# Patient Record
Sex: Female | Born: 1970 | Race: Black or African American | Hispanic: No | Smoking: Current every day smoker
Health system: Southern US, Community
[De-identification: ages and names within clinical notes are randomized; demographics above are authoritative.]

## PROBLEM LIST (undated history)

## (undated) DIAGNOSIS — R7303 Prediabetes: Secondary | ICD-10-CM

## (undated) DIAGNOSIS — G35D Multiple sclerosis, unspecified: Secondary | ICD-10-CM

## (undated) DIAGNOSIS — G4730 Sleep apnea, unspecified: Secondary | ICD-10-CM

## (undated) DIAGNOSIS — G35 Multiple sclerosis: Secondary | ICD-10-CM

## (undated) DIAGNOSIS — I10 Essential (primary) hypertension: Secondary | ICD-10-CM

## (undated) HISTORY — DX: Multiple sclerosis, unspecified: G35.D

## (undated) HISTORY — PX: BACK SURGERY: SHX140

## (undated) HISTORY — DX: Prediabetes: R73.03

## (undated) HISTORY — PX: ABDOMINAL HYSTERECTOMY: SHX81

## (undated) HISTORY — DX: Multiple sclerosis: G35

## (undated) HISTORY — PX: OTHER SURGICAL HISTORY: SHX169

---

## 1999-12-07 ENCOUNTER — Other Ambulatory Visit: Admission: RE | Admit: 1999-12-07 | Discharge: 1999-12-07 | Payer: Self-pay | Admitting: Obstetrics and Gynecology

## 2000-01-11 ENCOUNTER — Ambulatory Visit (HOSPITAL_COMMUNITY): Admission: RE | Admit: 2000-01-11 | Discharge: 2000-01-11 | Payer: Self-pay | Admitting: Orthopedic Surgery

## 2000-01-11 ENCOUNTER — Encounter: Payer: Self-pay | Admitting: Orthopedic Surgery

## 2000-01-25 ENCOUNTER — Ambulatory Visit (HOSPITAL_COMMUNITY): Admission: RE | Admit: 2000-01-25 | Discharge: 2000-01-25 | Payer: Self-pay | Admitting: Orthopedic Surgery

## 2000-01-25 ENCOUNTER — Encounter: Payer: Self-pay | Admitting: Orthopedic Surgery

## 2000-02-10 ENCOUNTER — Encounter: Payer: Self-pay | Admitting: Orthopedic Surgery

## 2000-02-10 ENCOUNTER — Ambulatory Visit (HOSPITAL_COMMUNITY): Admission: RE | Admit: 2000-02-10 | Discharge: 2000-02-10 | Payer: Self-pay | Admitting: Pediatrics

## 2000-10-25 ENCOUNTER — Encounter: Payer: Self-pay | Admitting: Orthopedic Surgery

## 2000-10-25 ENCOUNTER — Encounter: Admission: RE | Admit: 2000-10-25 | Discharge: 2000-10-25 | Payer: Self-pay | Admitting: Orthopedic Surgery

## 2000-11-15 ENCOUNTER — Encounter: Payer: Self-pay | Admitting: Orthopedic Surgery

## 2000-11-15 ENCOUNTER — Encounter: Admission: RE | Admit: 2000-11-15 | Discharge: 2000-11-15 | Payer: Self-pay | Admitting: Orthopedic Surgery

## 2000-11-29 ENCOUNTER — Encounter: Admission: RE | Admit: 2000-11-29 | Discharge: 2000-11-29 | Payer: Self-pay | Admitting: Orthopedic Surgery

## 2000-11-29 ENCOUNTER — Encounter: Payer: Self-pay | Admitting: Orthopedic Surgery

## 2000-12-02 ENCOUNTER — Encounter: Admission: RE | Admit: 2000-12-02 | Discharge: 2000-12-02 | Payer: Self-pay | Admitting: Orthopedic Surgery

## 2001-04-03 ENCOUNTER — Ambulatory Visit (HOSPITAL_COMMUNITY): Admission: RE | Admit: 2001-04-03 | Discharge: 2001-04-03 | Payer: Self-pay | Admitting: Obstetrics and Gynecology

## 2001-04-03 ENCOUNTER — Encounter: Payer: Self-pay | Admitting: Obstetrics and Gynecology

## 2001-04-10 ENCOUNTER — Other Ambulatory Visit: Admission: RE | Admit: 2001-04-10 | Discharge: 2001-04-10 | Payer: Self-pay | Admitting: Obstetrics and Gynecology

## 2002-05-16 ENCOUNTER — Other Ambulatory Visit: Admission: RE | Admit: 2002-05-16 | Discharge: 2002-05-16 | Payer: Self-pay | Admitting: Obstetrics and Gynecology

## 2002-05-25 ENCOUNTER — Inpatient Hospital Stay (HOSPITAL_COMMUNITY): Admission: RE | Admit: 2002-05-25 | Discharge: 2002-05-26 | Payer: Self-pay | Admitting: Obstetrics and Gynecology

## 2002-05-25 ENCOUNTER — Encounter (INDEPENDENT_AMBULATORY_CARE_PROVIDER_SITE_OTHER): Payer: Self-pay

## 2002-06-06 ENCOUNTER — Encounter: Payer: Self-pay | Admitting: Obstetrics and Gynecology

## 2002-06-06 ENCOUNTER — Inpatient Hospital Stay (HOSPITAL_COMMUNITY): Admission: AD | Admit: 2002-06-06 | Discharge: 2002-06-06 | Payer: Self-pay | Admitting: Obstetrics and Gynecology

## 2003-05-15 ENCOUNTER — Emergency Department (HOSPITAL_COMMUNITY): Admission: EM | Admit: 2003-05-15 | Discharge: 2003-05-15 | Payer: Self-pay | Admitting: Emergency Medicine

## 2003-05-20 ENCOUNTER — Other Ambulatory Visit: Admission: RE | Admit: 2003-05-20 | Discharge: 2003-05-20 | Payer: Self-pay | Admitting: Obstetrics and Gynecology

## 2003-09-17 ENCOUNTER — Encounter: Admission: RE | Admit: 2003-09-17 | Discharge: 2003-09-27 | Payer: Self-pay | Admitting: Neurology

## 2004-05-25 ENCOUNTER — Ambulatory Visit: Payer: Self-pay | Admitting: Family Medicine

## 2004-08-27 ENCOUNTER — Ambulatory Visit: Payer: Self-pay | Admitting: Family Medicine

## 2004-09-16 ENCOUNTER — Emergency Department (HOSPITAL_COMMUNITY): Admission: EM | Admit: 2004-09-16 | Discharge: 2004-09-17 | Payer: Self-pay | Admitting: Emergency Medicine

## 2005-04-28 ENCOUNTER — Emergency Department (HOSPITAL_COMMUNITY): Admission: EM | Admit: 2005-04-28 | Discharge: 2005-04-29 | Payer: Self-pay | Admitting: Emergency Medicine

## 2005-06-14 ENCOUNTER — Emergency Department (HOSPITAL_COMMUNITY): Admission: EM | Admit: 2005-06-14 | Discharge: 2005-06-15 | Payer: Self-pay | Admitting: Emergency Medicine

## 2005-11-10 ENCOUNTER — Inpatient Hospital Stay (HOSPITAL_COMMUNITY): Admission: RE | Admit: 2005-11-10 | Discharge: 2005-11-13 | Payer: Self-pay | Admitting: Orthopaedic Surgery

## 2006-05-20 ENCOUNTER — Emergency Department (HOSPITAL_COMMUNITY): Admission: EM | Admit: 2006-05-20 | Discharge: 2006-05-20 | Payer: Self-pay | Admitting: Family Medicine

## 2006-05-26 ENCOUNTER — Encounter: Admission: RE | Admit: 2006-05-26 | Discharge: 2006-05-26 | Payer: Self-pay | Admitting: *Deleted

## 2006-06-02 ENCOUNTER — Encounter: Admission: RE | Admit: 2006-06-02 | Discharge: 2006-06-02 | Payer: Self-pay | Admitting: Rheumatology

## 2006-09-16 ENCOUNTER — Other Ambulatory Visit: Admission: RE | Admit: 2006-09-16 | Discharge: 2006-09-16 | Payer: Self-pay | Admitting: *Deleted

## 2007-06-23 ENCOUNTER — Encounter: Admission: RE | Admit: 2007-06-23 | Discharge: 2007-06-23 | Payer: Self-pay | Admitting: Family Medicine

## 2010-09-11 NOTE — Discharge Summary (Signed)
   NAME:  Brooke Ballard, Brooke Ballard                          ACCOUNT NO.:  [id number]   MEDICAL RECORD NO.:  [id number]                   PATIENT TYPE:  INP   LOCATION:  9304                                 FACILITY:  [facility]   PHYSICIAN:  Naima A. Dillard, M.D.              DATE OF BIRTH:  July 26, 1970   DATE OF ADMISSION:  05/25/2002  DATE OF DISCHARGE:  05/26/2002                                 DISCHARGE SUMMARY   ADMISSION DIAGNOSES:  1. Symptomatic fibroids  2. Menorrhagia.   DISCHARGE DIAGNOSES:  Status post laparoscopic-assisted vagina hysterectomy  and lysis of adhesions for fibroids causing menorrhagia and also pelvic  adhesions.   PROCEDURES:  Laparoscopic-assisted vaginal hysterectomy and lysis of  adhesions.   HOSPITAL COURSE:  The patient underwent a laparoscopic-assisted vaginal  hysterectomy on 05/25/2002 for adhesions and symptomatic fibroids.  The  surgery was uncomplicated.  Postoperatively, the patient remained afebrile  with stable vital signs.  Her exam showed normal abdomen with good bowel  sounds, soft and nontender.  She had mild vaginal bleeding, scant draining  on her packing when removed.   Pertinent laboratories showed that her preop hemoglobin was 11.6.  Postoperative hemoglobin 9.1.  Preoperative BUN and creatinine were 11 and  0.8, postop 7 and 0.7.  The patient is doing well.  Her  Foley was  discontinued.  Her packing was removed. She will eat a regular diet, and,  once voiding, can be discharged home.                                               Naima A. Normand Sloop, M.D.    NAD/MEDQ  D:  05/26/2002  T:  05/26/2002  Job:  [id number]

## 2010-09-11 NOTE — Discharge Summary (Signed)
NAMEMELVIN, Brooke Ballard                ACCOUNT NO.:  [id number]   MEDICAL RECORD NO.:  [id number]          PATIENT TYPE:  INP   LOCATION:  3030                         FACILITY:  [facility]   PHYSICIAN:  Sharolyn Douglas, M.D.        DATE OF BIRTH:  [birth year]   DATE OF ADMISSION:  11/10/2005  DATE OF DISCHARGE:  11/13/2005                                 DISCHARGE SUMMARY   ADMITTING DIAGNOSES:  1. L5-S1 spondylolisthesis grade 2.  2. Hypertension.  3. History of ulcers.   DISCHARGE DIAGNOSES:  1. Status post L4-S1 posterior spinal fusion doing well.  2. Postoperative blood loss anemia.  3. L5-S1 spondylolisthesis, grade 2.  4. Hypertension.  5. History of ulcers.   PROCEDURES:  On July 18th, patient was taken to the operating room for an L4-  S1 posterior spinal fusion with pedicle screws as well as an L5-S1 TLIF and  an L5 laminectomy.  Surgeon was Sharolyn Douglas.  Assistant was [employer].  Anesthesia used was general.   CONSULTS:  None.   LABS:  Preoperative CBC with diff within normal limits with the exception of  white count slightly elevated at 10.6, PT/INR and PTT normal, complete  metabolic panel was normal.  Postoperatively, H&H were monitored daily which  were low of 11.0 and 32.9 on November 13, 2005 and was asymptomatic.  Basic  metabolic panel monitored x2 days postoperatively did show a slightly  elevated glucose at 116 and 124 on postop day one and two, respectively,  also calcium decreased to 7.9 and 8.1 on postop day one and two,  respectively.  UA was negative.  Blood typing was type O-positive, antibody  screen was negative.   EKG done on November 04, 2005 showed normal sinus rhythm, no changes since last  tracing in January of 2005 read by Lady Deutscher, M.D.   X-rays:  Complete lumbar series on November 10, 2005 does show status post  placement of screws at L4-5 and L5-S1, no complicating features, on July 21  shows status post L4-S1 fusion.   BRIEF HISTORY:  Patient is a  40 year old female who has had longstanding  problems with her back.  Her pain has been getting progressively worse.  She  has failed improvement with conservative treatments that we have tried.  The  pain at this point is interfering with her activities of daily living and  inability to work.  She describes the pain as severe.  It was felt her best  course of management secondary to her grade 2 anterolisthesis in L5-S1  surgical intervention would be warranted.  It was felt secondary to the  severity of __________ and the patient's size, we would need to do a two-  level lumbar fusion at L4-5 and L5-S1.  Risks and benefits of this procedure  were discussed with the patient at length by Dr. Noel Gerold as well as myself,  she indicated understanding and opted to proceed.   HOSPITAL COURSE:  On November 10, 2005, patient was admitted to the hospital and  taken to the operating room for the above-listed procedure.  She tolerated  procedure well without any intraoperative complications and was transferred  to the recovery room in stable condition.  There was 500 mL of blood loss  intraoperatively and one unit returned via cell saver.   Postoperatively, routine orthopedic spine protocol was followed, she  progressed along well and did not develop any significant complications  postoperatively.   Physical therapy and occupational therapy did work with her on a daily basis  and she progressed along extremely well with them.   By November 13, 2005, patient had met all orthopedic goals, she was stable,  ambulating, she was independent with her brace and she was medically stable  as well for discharge.   DISCHARGE PLAN:  Patient is a 40 year old female status post L4-S1 posterior  spinal fusion doing well, activity is daily ambulation program, brace on  when she is up.  No lifting heavier than five pounds.  Progressive  ambulation program.  Back precautions at all times.  Follow up two weeks   postoperatively with Dr. Noel Gerold.   DIET:  Regular home diet as tolerated.   MEDICATIONS:  1. Vicodin for pain.  2. Robaxin for muscle spasm.  3. Multivitamin daily.  4. Calcium daily.  5. Colace daily.  6. Laxative as needed.   CONDITION ON DISCHARGE:  Stable, improved.   DISPOSITION:  The patient is being discharged to her home with her family's  assistance as well as home health physical therapy and occupational therapy.      Verlin Fester, P.A.      Sharolyn Douglas, M.D.  Electronically Signed    CM/MEDQ  D:  01/05/2006  T:  01/05/2006  Job:  [id number]

## 2010-09-11 NOTE — H&P (Signed)
NAME:  Brooke Ballard, Brooke Ballard NO.:  [id number]   MEDICAL RECORD NO.:  [id number]           PATIENT TYPE:   LOCATION:                               FACILITY:  [facility]   PHYSICIAN:  Sharolyn Douglas, M.D.        DATE OF BIRTH:  1970/10/11   DATE OF ADMISSION:  DATE OF DISCHARGE:                                HISTORY & PHYSICAL   CHIEF COMPLAINT:  Low back and left lower extremity pain.   HISTORY OF PRESENT ILLNESS:  This is a 40 year old female who has been  having problems with her back for several months now.  Unfortunately her  pain has been getting progressively worse.  She has failed to improve with  any conservative treatments.  Her pain is so severe at this point it is  interfering with her activities of daily living and ability to work.  The  risks and benefits of the proposed surgery and L4-S1 posterior spinal fusion  were discussed with the patient by Dr. Noel Gerold previously and again today by  myself.  She indicated understanding and opted to proceed.  Because of the  severity of her anterolisthesis at L5-S1, it was thought she would do best  with the L4-S1 fusion.   ALLERGIES:  None.  She is intolerant to MORPHINE.   MEDICATIONS:  Toprol, Valtrex, and Tylenol.   PAST MEDICAL HISTORY:  Hypertension.   PAST SURGICAL HISTORY:  1.  Left wrist ganglion removed.  2.  C-section.  3.  Tubal ligation.  4.  Hysterectomy.   SOCIAL HISTORY:  The patient smokes 4-5 cigarettes per day, drinks 1-2  drinks of wine per week.  Her family will be available to help her through  her postoperative course.  She is separated.   FAMILY HISTORY:  Noncontributory.   REVIEW OF SYSTEMS:  The patient denies fevers, chills, sweats, or bleeding  tendencies.  CNS:  Denies blurred vision, double vision, seizures,  headaches, paralysis.  CARDIOVASCULAR:  Denies chest pain, angina,  orthopnea, claudication, or palpitations.  PULMONARY:  Denies shortness of  breath, __________ , or hemoptysis.   GASTROINTESTINAL:  Denies nausea,  vomiting, constipation, diarrhea, melena, bloody stools.  GENITOURINARY:  Denies dysuria, hematuria, or discharge.  MUSCULOSKELETAL:  As per HPI.   PHYSICAL EXAMINATION:  VITAL SIGNS:  Pulse is 76 and regular, respirations  16 and unlabored, blood pressure is 160/100.  I did discuss this with her  today.  She states that she has not taken her blood pressure medication for  about one week.  I stressed the importance of getting back on her blood  pressure medication.  She is going to go to the pharmacy and get this filled  today.  I do anticipate her blood pressure will be under better control when  she comes to the hospital for her preoperative exam.  GENERAL APPEARANCE:  The patient is a 40 year old black female who is alert  and oriented, in no acute distress.  She is well-nourished, well-groomed,  appears stated  age, pleasant and cooperative to exam.  She is significantly  overweight.  HEENT:  Head is normocephalic, atraumatic.  Pupils equal, round, and  reactive to light.  Extraocular movements intact.  Nares patent.  Pharynx is  clear.  NECK:  Supple to palpation.  No lymphadenopathy, thyromegaly, or bruits  appreciated.  CHEST:  Clear to auscultation bilaterally.  No rales, rhonchi, stridor ,  wheezing, friction, or rubs.  BREASTS:  Not pertinent, not performed.  HEART:  S1/S2, regular rate and rhythm.  No murmurs, gallops, or rubs noted.  ABDOMEN:  Soft to palpation.  Nontender, nondistended.  No organomegaly  noted.  Positive bowel sounds  throughout.Marland Kitchen  GENITOURINARY:  Not pertinent, not performed.  EXTREMITIES:  As per HPI.  SKIN:  Intact without any lesions or rashes.   LABORATORY DATA:  X-ray showed a grade III isthmic spondylolisthesis at L5-  S1.   IMPRESSION:  1.  Spondylolisthesis, L5-S1.  2.  Hypertension.  3.  History of ulcers.   PLAN:  1.  Admit to [facility], November 10, 2005, for an L4-S1 posterior      spinal  fusion.  We are going up to the L4 level secondary to the      severity of her slip as well as her size for extra points of fixation.      Dr. Noel Gerold will be doing the surgery.  2.  The patient will receive PCA, Dilaudid postoperatively because of her      intolerance to morphine.  We will also use Vicodin for pain.  3.  The patient will likely be discharged to her home with the family's      assistance as well as home health physical therapy and occupational      therapy through [place].      Verlin Fester, P.A.      Sharolyn Douglas, M.D.  Electronically Signed    CM/MEDQ  D:  10/26/2005  T:  10/26/2005  Job:  [id number]   cc:   Sharolyn Douglas, M.D.  Fax: [phone]

## 2010-09-11 NOTE — Op Note (Signed)
NAMEBRADEN, Ballard                ACCOUNT NO.:  [id number]   MEDICAL RECORD NO.:  [id number]          PATIENT TYPE:  INP   LOCATION:  3030                         FACILITY:  [facility]   PHYSICIAN:  Sharolyn Douglas, M.D.        DATE OF BIRTH:  22-Sep-1970   DATE OF PROCEDURE:  11/10/2005  DATE OF DISCHARGE:                                 OPERATIVE REPORT   DIAGNOSIS:  1.  L5-S1 isthmic spondylolisthesis.  2.  Left greater than right L5 radiculopathy.  3.  L4-L5 degenerative disk disease and spondylosis.   PROCEDURE:  1.  L4-L5 and L5-S1 laminectomy with wide decompression of the thecal sac      and nerve roots bilaterally.  2.  L4-S1 posterior spinal arthrodesis.  3.  Transforaminal lumbar interbody fusion L5-S1 with placement of the 9-mm      PEEK cage.  4.  Segmental pedicle screw instrumentation L4 through S1 using the Abbott      spine system.  5.  Local autogenous bone graft supplemented with bone morphogenic protein.   SURGEON:  Sharolyn Douglas, M.D.   ASSISTANTJill Side Mahar, P.A.-C.   ANESTHESIA:  General endotracheal anesthesia.   ESTIMATED BLOOD LOSS:  400 mL.   COMPLICATIONS:  None.   INDICATIONS:  The patient is a pleasant lady with progressive back and left  greater than right lower extremity pain and radiculopathy.  Her imaging  studies demonstrated grade 2 spondylolisthesis with motion on flexion and  extension radiographs.  She has failed to respond to conservative treatment  measures.  At this point, she elected to undergo L4 through S1 decompression  and fusion in hopes of improving her symptoms.  Risks and benefits were  extensively reviewed.   PROCEDURE:  She was identified in the holding area and taken to the  operating room.  She underwent general endotracheal anesthesia without  difficulty and given prophylactic IV antibiotics.  She was carefully turned  prone onto the Wilson frame.  All bony prominences were padded.  The face  and eyes were protected at all  times.  She is a large woman and we did our  best to decompress her abdomen.  Neural monitoring was established in the  form of SSEPs and lower extremity EMGs.  The back was prepped and draped in  the usual sterile fashion.  A midline incision was made from L3 down to the  sacrum.  Dissection was carried sharply through the deep fascia.  A  subperiosteal exposure was then carried out to the tips the transverse  processes of L4, L5, as well as the sacral ala bilaterally.  The L5 spinous  process was loose consistent with the pars fractures.  Interoperative x-ray  was taken to confirm appropriate levels.  Deep retractors were placed.  We  then turned our attention to performing a laminectomy by removing the entire  spinous process and lamina of the L5 as well as inferior 1/3 of the L4  lamina and spinous process.  The ligamentum flavum was removed piecemeal.  We found that the L5-S1 foramen were encumbered due  to the spondylolisthesis  and fibrous material from the pars fractures.  The L5 nerve roots were  severely compressed by the pseudo-disk bulge and the spondylolisthesis with  compression of the nerve root between the L5 pedicle and the lip of the  sacrum.  This was decompressed widely using the Kerrison punches.  Once the  L5 nerve roots were widely decompressed bilaterally, the S1 nerve roots were  identified.  Decompression was carried distally.  We then evaluated the  foramen.  It was clear that there was ongoing pressure due to the slippage  and the posterior level of the sacrum compressing the nerve root up against  the L5 pedicles.  At this point, we placed pedicle screws at L4, L5 and S1  bilaterally using anatomic probing technique, we could also palpate the  pedicles from within the spinal canal.  We utilized 6.5 x 50 mm screws in  L4, 6.5 x 45 mm screws in L5, and 7.5 x 30 mm screws in the sacrum.  The  bone quality was excellent and the screw purchase was good.  We  stimulated  each screw using triggered EMGs and there were no deleterious changes.  We  then turned our attention to performing a transforaminal lumbar interbody  fusion on the left side at L5-S1.  The disk space was identified and the  remaining facette joints osteotomized.  The L5 and S1 nerve roots were  visualized and protected at all times and free running EMGs were monitored.  The disk space was entered and dilators were used to progressively dilate  the space up to 9 mm.  This caused an indirect reduction of  the  spondylolisthesis.  The cartilaginous endplates were scraped clean and a  radical diskectomy was completed.  We then packed the disk space with BMP  sponges along with bone graft taken from the laminectomy.  The 9-mm PEEK  cage was inserted into the interspace, tapped tamped anteriorly and across  the midline.  We then completed the posterior fusion by decorticating the  transverse processes of L4, L5, and the sacral ala bilaterally.  The  remaining bone graft was packed into the lateral gutters.  At this point, we  turned our attention to taking an interoperative x-ray which showed good  position of the pedicle screws and TLIF graft.  We bent titanium rods into  lordosis purposely under bending the rod in the middle to allow for further  reduction of the spondylolisthesis.  We placed the rods into the polyaxial  screw heads and used the reduction device to pull the L5 screws posteriorly,  further reducing the spondylolisthesis.  We took another interoperative x-  ray after the reduction maneuver and the slip was 90% reduced.  We then  sheared off the locking caps and placed a cross connector.  The wound was  irrigated.  A final check of the L5-S1 foramen showed that the nerves were  now widely decompressed.  Gelfoam was left over the exposed epidural space.  Hemostasis was achieved.  A Hemovac drain was left in place.  The deep fascia was closed with a running #1 Vicryl  suture.  The subcutaneous layer  was closed with 0 Vicryl and 2-0 Vicryl followed by a running 3-0  subcuticular Vicryl suture on the skin edges.  Benzoin and Steri-Strips were  placed.  A sterile dressing was applied.  The patient was turned supine,  extubated without difficulty, and transferred to recovery in stable  condition, able to move  her upper and lower extremities.  There were no  changes in the EMGs or SSEPs.  Sponge and needle count was correct.  It  should be noted that my assistant helped throughout the procedure including  positioning during the exposure, during the decompression, the  instrumentation, and also with arthrodesis.  She assisted with wound  closure.      Sharolyn Douglas, M.D.  Electronically Signed     MC/MEDQ  D:  11/10/2005  T:  11/10/2005  Job:  [id number]

## 2010-09-11 NOTE — Op Note (Signed)
NAME:  Brooke Ballard, Brooke Ballard                          ACCOUNT NO.:  [id number]   MEDICAL RECORD NO.:  [id number]                   PATIENT TYPE:  INP   LOCATION:  9399                                 FACILITY:  [facility]   PHYSICIAN:  Naima A. Dillard, M.D.              DATE OF BIRTH:  1970-08-12   DATE OF PROCEDURE:  05/25/2002  DATE OF DISCHARGE:                                 OPERATIVE REPORT   PREOPERATIVE DIAGNOSES:  1. Symptomatic fibroids.  2. Menorrhagia.  3. Dysmenorrhea.   POSTOPERATIVE DIAGNOSES:  1. Symptomatic fibroids.  2. Menorrhagia.  3. Dysmenorrhea.  4. Pelvic adhesions.   PROCEDURES:  1. Laparoscopically assisted vaginal hysterectomy.  2. Lysis of adhesions.   ANESTHESIA:  General endotracheal tube.   SURGEON:  Naima A. Normand Sloop, M.D.   ASSISTANT:  Dois Davenport A. Rivard, M.D.   ESTIMATED BLOOD LOSS:  550 mL.   URINE OUTPUT:  450 mL clear urine at the end of the procedure.   INTRAVENOUS FLUIDS:  2700 mL crystalloid.   FINDINGS:  Dense bladder and uterine adhesions and also lower  uterine/anterior abdominal wall adhesions, about 14 weeks size fibroid  uterus and normal appearing liver and bowel.   COMPLICATIONS:  None.   DISPOSITION:  The patient went to the recovery room in stable condition.   DESCRIPTION OF PROCEDURE:  Before the patient was taken to the operating  room, she understood that her risks were, but not limited to, anesthetic  complications, damage to bowel, bladder, major blood vessels, and ureters,  __________, and exploratory laparotomy with same, hysterectomy.  The patient  was taken to the operating room.  She was placed in dorsal supine position  oblique due to a history of back disk injury to make sure she was not having  any pain and then put asleep.  She was then prepped and draped in a normal  sterile fashion.  A bivalve speculum was placed into the vagina.   The anterior lip of the cervix was grasped with a single tooth tenaculum and  the uterine manipulator Hulka was placed into the uterine cavity and  attached to the cervix.  The single tooth tenaculum was removed.  The  bivalve speculum was removed.  A Foley catheter was placed.   Attention was then turned to the patient's abdomen where a 10 mm  supraumbilical incision was made with the scalpel.  This was done because  the patient had a previous umbilical hernia repair.  The incision was  carried down to the fascia.  The fascia was then incised in the midline,  extended bilaterally by using Mayo scissors.  The peritoneum was then  identified, tented up and entered sharply and we were noted to be in the  peritoneal cavity.  The fascia was then sutured with #0 Vicryl in a  pursestring fashion.  A Hasson was then placed into the cavity and the  abdomen was  insufflated with about 3 L of CO2 gas.  Intraabdominal placement  was confirmed with the laparoscope.  Findings noted above were seen.   It was then felt that her cul-de-sac was probably free with Trendelenburg so  we placed a 5 mm incision after 5 mL 0.25% Marcaine was placed in the right  lower quadrant.  A 5 mm incision was then made with a scalpel and a 5 mm  trocar was placed under direct visualization with the laparoscope.  Hemostasis was noted.  A probe was then placed into the abdominal cavity and  the bowel was moved away from the cul-de-sac.  The cul-de-sac was noted to  be free.   A similar incision on the left abdominal quadrant was made with the scalpel  after infiltrating 2.5 mL of 0.25% Marcaine.  A 5 mm trocar was placed under  direct visualization of the laparoscope.  Hemostasis was noted.  Attention  was then turned to the patient's right round ligament which was cauterized  and cut.  Hemostasis was assured.  The patient was noted to have a missing  right ovary and tube secondary to her previous history of having it removed.  Also, there was an anterior abdominal/ uterine wall adhesion which was   fulgurated and ligated using tripolar cautery and hemostasis was assured.  The uterine artery was dissected and fulgurated and hemostasis was noted.   Attention was then turned to the patient's left round ligament which was  fulgurated x3 and cut.  The vesicouterine peritoneum was then identified and  entered sharply with Metzenbaum scissors, then both sharply and dissected  the bladder away from the uterus and cervix.  The left utero-ovarian  ligament was fulgurated and cut.  Hemostasis was assured.  This dissection  carried down to the uterine artery which was just fulgurated.   Attention was then turned to the patient's vagina.  A weighted speculum was  placed in the posterior peritoneum of the vagina and vaginal retractors  right angles were then used to retract.  The cervix was then infiltrated  with vasopressin 20 units and 100 mL of saline.  Approximately 20 mL was  infiltrated in the cervix in a circumferential fashion.  Circumferential  incision was then made with the scalpel and using Metzenbaum scissors  further dissection was then done.  Also, blunt dissection was done with a  sponge on my finger.  The posterior vaginal wall was then grasped with  pickups with teeth and the posterior cul-de-sac was then entered with Mayo  scissors and the large weighted speculum was placed into the peritoneal  cavity.  Hemostasis was assured.   Attention was then turned to the anterior portion of the uterus and it was  lifted up with pickups with teeth and Metzenbaum scissors and the right side  of the anterior peritoneum was entered sharply.  Hemostasis was assured.  The bladder was still noted to be intact on palpation.  The right  uterosacral ligament was clamped, cut, and suture ligated with #0 Vicryl.  Hemostasis was assured.  The right uterine artery pedicle was clamped with Heaney clamps, cut, and suture ligated.  Hemostasis was assured.  Attention  was then turned to the left.  There  was a large fibroid in the lower uterine  segment of the uterus.  It was difficult to see any of the pedicles.  The  left uterosacral ligament, however, was clamped with Heaney clamps, cut, and  suture ligated.  Hemostasis was assured.  No  further pedicles could be seen  at this point.  The myoma was removed by way of myomectomy and then the left  uterine artery pedicle was clamped, cut, and suture ligated.  Hemostasis was  assured.   The uterus was then delivered without difficulty and handed off for weight.  A sponge on a stick was placed into the vaginal cavity and all the pedicles  were noted to be hemostatic.  A McCall suture was then placed with #0  Vicryl.  The vaginal cuff was closed with #0 chromic on a UR6 using figure-  of-eight interrupted suture.  A sponge on a stick was then placed into the  vaginal cavity.   Attention was then turned to the abdomen.  The abdomen was reinsufflated.  The cuff was irrigated with normal saline.  There were a few areas that had  minimal bleeding right around the vaginal cuff.  This was made hemostatic  with tripolar cautery.  Full irrigation was done.  All clots were removed  from the vaginal floor and the gas was allowed to leave the abdomen.  The  instruments were removed under visualization with the laparoscope including  umbilical trocar, the pursestring suture placed was then tied and no defect  was noted.  The subcutaneous tissue was closed with #0 chromic in a running  fashion.  The skin incision was closed with 3-0 Vicryl in a subcuticular  fashion.  The sponge on a stick was then removed from the vagina and the  vagina was packed with 2 inch gauze soaked in clindamycin.  Sponge, lap, and  needle counts were correct x2.   The patient went to recovery room in stable condition.                                               Naima A. Normand Sloop, M.D.    NAD/MEDQ  D:  05/25/2002  T:  05/25/2002  Job:  [id number]

## 2010-09-11 NOTE — H&P (Signed)
NAME:  Brooke Ballard, Brooke Ballard                          ACCOUNT NO.:  [id number]   MEDICAL RECORD NO.:  [id number]                   PATIENT TYPE:  INP   LOCATION:  NA                                   FACILITY:  [facility]   PHYSICIAN:  Naima A. Dillard, M.D.              DATE OF BIRTH:  09-30-1970   DATE OF ADMISSION:  DATE OF DISCHARGE:                                HISTORY & PHYSICAL   HISTORY OF PRESENT ILLNESS:  The patient is a 39 year old African-American  female, gravida 3, para 3, whose last menstrual period was December 31, 2001.  The patient presented to me in February 2003 complaining of having  heavy painful periods unrelieved by Darvocet, Anaprox and Depo-Provera.  The  patient's last Depo was in November 2002 and it did cause her to irregular  bleeding, but it did not help with the pain as well as the pain medicines.  The patient has been having irregular bleeding and menorrhagia for about  five to six years.  The patient tried Depo-Provera one last time, but  despite the Depo-Provera still had irregular bleeding with dysmenorrhea and  has decided that she desires to have a hysterectomy.  The patient had an  ultrasound, which demonstrated her uterus to be 14.6 x 6.3 with a 6.3 cm  inferior fundal fibroid.   ALLERGIES:  The patient has no known drug allergies.   PAST SURGICAL HISTORY:  Significant for a tubal ligation with a cesarean  section.  Also a cesarean section and a right oophorectomy.  Hernia repair,  also cryosurgery of the cervix in 1986.   PAST MEDICAL HISTORY:  Significant for hypertension.   MEDICATIONS:  Medications include Toprol and acyclovir p.r.n. herpes  outbreak.   PAST SURGICAL HISTORY:  Negative for tobacco, alcohol or drug use.   PAST OBSTETRICAL HISTORY:  The patient's periods are irregular since being  on the Depo.  She has had irregular heavy bleeding causing pain.  she has a  history of herpes in the past.  Denies any history of an abnormal Pap  smear.   FAMILY HISTORY:  Significant for hypertension in her mother, diabetes in her  uncle and no history of Gyn cancer.   REVIEW OF SYSTEMS:  Unremarkable, except GYNECOLOGICAL history; as above.   PHYSICAL EXAMINATION:  VITAL SIGNS:  The patient weighs 296 pounds.  Blood  pressure is 140/98.  HEENT:  Pupils are equal.  Hearing is normal. Throat is clear.  NECK:  Thyroid is not enlarged.  HEART:  Regular rate and rhythm.  LUNGS:  Clear to auscultation bilaterally.  BREASTS:  No masses, discharge, skin changes or nipple retraction  bilaterally.  BACK:  No CVA tenderness bilaterally.  ABDOMEN:  Obese, nondistended.  Well healed incisions from previous  surgeries.  EXTREMITIES:  Have no cyanosis, clubbing or edema.  NEUROLOGIC EXAMINATION:  Within normal limits.  Nonfocal.  VAGINAL EXAMINATION:  Within normal limits.  Uterus is nontender without any  lesions.  It is difficult to determine the uterine size secondary to the  patient's obesity.  No cervical motion tenderness.  Uterus is nontender and  mobile.  Adnexa; she has no masses and no tenderness.   LABORATORY DATA:  Pertinent labs show endometrial biopsy was benign  proliferative endometrium.  No hyperplasia or malignancy.  Pap smear was  negative for intraepithelial lesions or malignancy.  Ultrasound as above.   ASSESSMENT:  1. Menorrhagia.  2. Anemia; last hemoglobin was 10.4, secondary to symptomatic fibroids.   The patient desires definitive treatment.  The patient was given options for  uterine artery embolization, Lupron, Depo-Provera and if her cavity looked  normal a Provera IUD, or hysterectomy.  The patient has chosen to proceed  with a hysterectomy.   We will attempt laparoscopic assisted vaginal hysterectomy due to a history  of multiple C-sections; however, if there are multiple adhesions that will  not allow me to proceed we will do a total abdominal hysterectomy.  The  patient understands that her risks  are, but not limited to anesthetic  complications, bleeding infection, damage to internal organs such as bowel,  bladder, ureters and major blood vessels, also poor wound healing from  healing secondary to her weight, and obesity; and, the risks and benefits of  treatments were reviewed.  The patient also only has one ovary and was told  that there is a possibility she could loose her ovary during the surgery,  which could cause her to go into menopause.  The patient still desired to  proceed with the surgery.  She also was checked for diabetes recently, which  was found to be negative.                                                 Naima A. Normand Sloop, M.D.    NAD/MEDQ  D:  05/24/2002  T:  05/25/2002  Job:  [id number]

## 2010-10-09 ENCOUNTER — Other Ambulatory Visit: Payer: Self-pay | Admitting: Family Medicine

## 2010-10-09 DIAGNOSIS — Z1231 Encounter for screening mammogram for malignant neoplasm of breast: Secondary | ICD-10-CM

## 2010-10-21 ENCOUNTER — Ambulatory Visit: Payer: Self-pay

## 2010-12-21 ENCOUNTER — Ambulatory Visit
Admission: RE | Admit: 2010-12-21 | Discharge: 2010-12-21 | Disposition: A | Discharge status: Home / Self Care | Payer: Medicaid Other | Source: Ambulatory Visit | Attending: Family Medicine | Admitting: Family Medicine

## 2010-12-21 ENCOUNTER — Other Ambulatory Visit: Payer: Self-pay | Admitting: Family Medicine

## 2010-12-21 DIAGNOSIS — J189 Pneumonia, unspecified organism: Secondary | ICD-10-CM

## 2011-08-16 ENCOUNTER — Other Ambulatory Visit: Payer: Self-pay | Admitting: Family Medicine

## 2011-08-16 ENCOUNTER — Ambulatory Visit
Admission: RE | Admit: 2011-08-16 | Discharge: 2011-08-16 | Disposition: A | Discharge status: Home / Self Care | Payer: BC Managed Care – PPO | Source: Ambulatory Visit | Attending: Family Medicine | Admitting: Family Medicine

## 2011-08-16 DIAGNOSIS — Z1231 Encounter for screening mammogram for malignant neoplasm of breast: Secondary | ICD-10-CM

## 2012-07-09 ENCOUNTER — Emergency Department (HOSPITAL_COMMUNITY): Payer: Managed Care, Other (non HMO)

## 2012-07-09 ENCOUNTER — Encounter (HOSPITAL_COMMUNITY): Payer: Self-pay | Admitting: Emergency Medicine

## 2012-07-09 ENCOUNTER — Emergency Department (HOSPITAL_COMMUNITY)
Admission: EM | Admit: 2012-07-09 | Discharge: 2012-07-10 | Disposition: A | Discharge status: Home / Self Care | Payer: Managed Care, Other (non HMO) | Attending: Emergency Medicine | Admitting: Emergency Medicine

## 2012-07-09 DIAGNOSIS — F17200 Nicotine dependence, unspecified, uncomplicated: Secondary | ICD-10-CM | POA: Insufficient documentation

## 2012-07-09 DIAGNOSIS — R0789 Other chest pain: Secondary | ICD-10-CM

## 2012-07-09 DIAGNOSIS — R071 Chest pain on breathing: Secondary | ICD-10-CM | POA: Insufficient documentation

## 2012-07-09 DIAGNOSIS — Z79899 Other long term (current) drug therapy: Secondary | ICD-10-CM | POA: Insufficient documentation

## 2012-07-09 DIAGNOSIS — I10 Essential (primary) hypertension: Secondary | ICD-10-CM | POA: Insufficient documentation

## 2012-07-09 DIAGNOSIS — IMO0002 Reserved for concepts with insufficient information to code with codable children: Secondary | ICD-10-CM | POA: Insufficient documentation

## 2012-07-09 HISTORY — DX: Essential (primary) hypertension: I10

## 2012-07-09 LAB — COMPREHENSIVE METABOLIC PANEL
ALT: 27 U/L (ref 0–35)
Albumin: 3.8 g/dL (ref 3.5–5.2)
BUN: 9 mg/dL (ref 6–23)
GFR calc Af Amer: >90 mL/min (ref >90)
GFR calc non Af Amer: >90 mL/min (ref >90)
Glucose, Bld: 100 mg/dL — ABNORMAL HIGH (ref 70–99)
Potassium: 3.8 mEq/L (ref 3.5–5.1)

## 2012-07-09 LAB — CBC WITH DIFFERENTIAL/PLATELET
Basophils Absolute: 0 10*3/uL (ref 0.0–0.1)
Basophils Relative: 0 % (ref 0–1)
Eosinophils Absolute: 0.2 10*3/uL (ref 0.0–0.7)
HCT: 44.2 % (ref 36.0–46.0)
Lymphs Abs: 4.1 10*3/uL — ABNORMAL HIGH (ref 0.7–4.0)
MCH: 30.4 pg (ref 26.0–34.0)
MCV: 87.9 fL (ref 78.0–100.0)
Monocytes Absolute: 0.6 10*3/uL (ref 0.1–1.0)
Platelets: 277 10*3/uL (ref 150–400)

## 2012-07-09 LAB — LIPASE, BLOOD: Lipase: 24 U/L (ref 11–59)

## 2012-07-09 MED ORDER — ASPIRIN 81 MG PO CHEW: 324.0000 mg | CHEWABLE_TABLET | Freq: Once | ORAL | Status: DC

## 2012-07-09 MED ORDER — KETOROLAC TROMETHAMINE 30 MG/ML IJ SOLN
30.0000 mg | Freq: Once | INTRAMUSCULAR | Status: AC
  Administered 2012-07-09: 30 mg via INTRAVENOUS
  Filled 2012-07-09: qty 1

## 2012-07-09 NOTE — ED Provider Notes (Signed)
History     CSN: [id number]  Arrival date & time 07/09/12  2125   First MD Initiated Contact with Patient 07/09/12 2145      Chief Complaint  Patient presents with  . Chest Pain    (Consider location/radiation/quality/duration/timing/severity/associated sxs/prior treatment) HPI Pt presents with 6 days of central chest pain that does not radiate. Not associated with N/V, SOB, fever, chills. Pt states pain has been constant. No worse with deep inspiration. No chest trauma. No recent travel or surgery. No lower ext swelling or pain. No DM, family history of CAD or PE. Pt with intermittent history of smoking.  Past Medical History  Diagnosis Date  . Hypertension     Past Surgical History  Procedure Laterality Date  . Abdominal hysterectomy    . Back surgery      No family history on file.  History  Substance Use Topics  . Smoking status: Current Every Day Smoker  . Smokeless tobacco: Not on file  . Alcohol Use: Yes     Comment: social    OB History   Grav Para Term Preterm Abortions TAB SAB Ect Mult Living                  Review of Systems  Constitutional: Negative for fever and chills.  Respiratory: Negative for cough, shortness of breath and wheezing.   Cardiovascular: Positive for chest pain. Negative for palpitations and leg swelling.  Gastrointestinal: Negative for nausea, vomiting and abdominal pain.  Musculoskeletal: Negative for myalgias and back pain.  Skin: Negative for pallor, rash and wound.  Neurological: Negative for dizziness, weakness, light-headedness and numbness.  All other systems reviewed and are negative.    Allergies  Contrast media and Morphine and related  Home Medications   Current Outpatient Rx  Name  Route  Sig  Dispense  Refill  . amLODipine (NORVASC) 10 MG tablet   Oral   Take 10 mg by mouth daily.         . citalopram (CELEXA) 10 MG tablet   Oral   Take 10 mg by mouth daily.         . fluticasone (FLONASE) 50  MCG/ACT nasal spray   Nasal   Place 2 sprays into the nose daily. PRN         . metoprolol-hydrochlorothiazide (LOPRESSOR HCT) 100-25 MG per tablet   Oral   Take 1 tablet by mouth daily.         . Multiple Vitamin (MULTIVITAMIN WITH MINERALS) TABS   Oral   Take 1 tablet by mouth daily.         . Vitamin D, Ergocalciferol, (DRISDOL) 50000 UNITS CAPS   Oral   Take 50,000 Units by mouth every 7 (seven) days. Takes twice a week           BP 138/84  Pulse 75  Temp(Src) 98.7 F (37.1 C) (Oral)  Resp 23  Ht 5\' 9"  (1.753 m)  Wt 290 lb (131.543 kg)  BMI 42.81 kg/m2  SpO2 94%  LMP 07/09/2012  Physical Exam  Nursing note and vitals reviewed. Constitutional: She is oriented to person, place, and time. She appears well-developed and well-nourished. No distress.  HENT:  Head: Normocephalic and atraumatic.  Mouth/Throat: Oropharynx is clear and moist.  Eyes: EOM are normal. Pupils are equal, round, and reactive to light.  Neck: Normal range of motion. Neck supple.  Cardiovascular: Normal rate and regular rhythm.   Pulmonary/Chest: Effort normal and breath sounds normal.  No respiratory distress. She has no wheezes. She has no rales. She exhibits tenderness (TTP over sternum).  Abdominal: Soft. Bowel sounds are normal. She exhibits no distension and no mass. There is no tenderness. There is no rebound and no guarding.  Musculoskeletal: Normal range of motion. She exhibits no edema and no tenderness.  No calf swelling or pain  Neurological: She is alert and oriented to person, place, and time.  5/5 motor, sensation intact  Skin: Skin is warm and dry. No rash noted. No erythema.  Psychiatric: She has a normal mood and affect. Her behavior is normal.    ED Course  Procedures (including critical care time)  Labs Reviewed  CBC WITH DIFFERENTIAL - Abnormal; Notable for the following:    Hemoglobin 15.3 (*)    Lymphs Abs 4.1 (*)    All other components within normal limits   COMPREHENSIVE METABOLIC PANEL - Abnormal; Notable for the following:    Glucose, Bld 100 (*)    Total Bilirubin 0.2 (*)    All other components within normal limits  D-DIMER, QUANTITATIVE - Abnormal; Notable for the following:    D-Dimer, Quant 0.53 (*)    All other components within normal limits  TROPONIN I  LIPASE, BLOOD   Dg Chest 2 View  07/09/2012  *RADIOLOGY REPORT*  Clinical Data: Chest pain  CHEST - 2 VIEW  Comparison: December 21, 2010  Findings:  Lungs clear.  Heart size and pulmonary vascularity are normal.  No adenopathy.  No pneumothorax.  No bone lesions.  IMPRESSION: No abnormality noted.   Original Report Authenticated By: Bretta Bang, M.D.    Ct Angio Chest W/cm &/or Wo Cm  07/10/2012  *RADIOLOGY REPORT*  Clinical Data: Central chest pain radiating to the left.  Shortness of breath.  White cell count 9.7.  Positive D-dimer.  Smoker.  CT ANGIOGRAPHY CHEST  Technique:  Multidetector CT imaging of the chest using the standard protocol during bolus administration of intravenous contrast. Multiplanar reconstructed images including MIPs were obtained and reviewed to evaluate the vascular anatomy.  Contrast:  100 ml Omnipaque 300  Comparison: Chest 07/09/2012  Findings: There is limitation of contrast bolus resulting in borderline diagnostic quality of the study.  The central pulmonary arteries are moderately well visualized and demonstrate no significant filling defect.  Distal segmental and peripheral branches are not well visualized and smaller emboli cannot be excluded.  The heart size is normal.  Normal caliber thoracic aorta.  No significant lymphadenopathy in the chest.  No abnormal mediastinal fluid collections.  The esophagus is decompressed. Motion artifact limits visualization of the lungs but there appears to be some dependent change in the posterior lungs.  No focal consolidation or airspace disease.  Airways appear patent.  No pleural effusion or pneumothorax.  IMPRESSION:  Technically limited study due to limited contrast bolus.  No large central pulmonary emboli are visualized but distal segmental or peripheral emboli could be obscured.  Lungs clear.   Original Report Authenticated By: Burman Nieves, M.D.      1. Chest wall pain      Date: 07/09/2012  Rate: 73  Rhythm: normal sinus rhythm  QRS Axis: normal  Intervals: normal  ST/T Wave abnormalities: normal  Conduction Disutrbances:none  Narrative Interpretation:   Old EKG Reviewed: none available    MDM    Pt pending labs. If neg trop, acute MI ruled out. Suspect chest wall etiology as cuase of pt symptoms given reproducibility with palpation  Loren Racer, MD 07/10/12 1536

## 2012-07-09 NOTE — ED Notes (Signed)
Pt c/o central chest pain radiating to L chest and to back onset Tuesday. Pt seen by PCP Thursday. Pt worse today, cramping in nature. Pt denies nausea or diaphoresis but does c/o SHOB today. Pt took ASA 324mg  PTA

## 2012-07-10 MED ORDER — DIPHENHYDRAMINE HCL 50 MG/ML IJ SOLN
25.0000 mg | Freq: Once | INTRAMUSCULAR | Status: AC
  Administered 2012-07-10: 25 mg via INTRAVENOUS
  Filled 2012-07-10: qty 1

## 2012-07-10 MED ORDER — IOHEXOL 350 MG/ML SOLN
100.0000 mL | Freq: Once | INTRAVENOUS | Status: AC | PRN
  Administered 2012-07-10: 100 mL via INTRAVENOUS

## 2012-07-10 MED ORDER — METHYLPREDNISOLONE SODIUM SUCC 125 MG IJ SOLR
125.0000 mg | Freq: Once | INTRAMUSCULAR | Status: AC
  Administered 2012-07-10: 125 mg via INTRAVENOUS
  Filled 2012-07-10: qty 2

## 2012-07-10 NOTE — ED Provider Notes (Signed)
Medical screening examination/treatment/procedure(s) were performed by non-physician practitioner and as supervising physician I was immediately available for consultation/collaboration.  Ethelda Chick, MD 07/10/12 [phone]

## 2012-07-10 NOTE — ED Provider Notes (Signed)
Brooke Ballard S 11:00 PM patient discussed in sign out with Dr. Ranae Palms. Patient with history of hypertension presenting with chest pain that began earlier in the week. Pain has been constant and worse with some movements. Pain was reproducible over her sternal and right-sided chest exam. No deformities. Patient thought to be low risk for ACS with unremarkable EKG. Lab tests pending.  Labs unremarkable aside from slightly elevated d-dimer. Will obtain CT and your chest to rule out PE. Patient is obese with history of hypertension and is a smoker. She denies any recent long travel. No swelling or tenderness in extremities. Exam on concerning for clinical signs of DVT. Patient without hemoptysis or syncope.  Patient develops slight numbness of face and itching to lower extremities following IV contrast for CT. Benadryl and Solu-Medrol given. No rash or swelling noted.  Patient was monitored without any acute complications from IV contrast. There were no definite signs of PE. Patient continues to have normal respirations and O2 sats. She is PERC negative. At this time we'll discharge home with chest wall pain.  Angus Seller, PA-C 07/10/12 [phone]

## 2012-07-10 NOTE — ED Notes (Signed)
Pt returned from CT c/o itching to the lower extremities and numbness around her mouth. MD notified and allergic rxn to lohexol charted.

## 2013-06-15 ENCOUNTER — Other Ambulatory Visit: Payer: Self-pay

## 2013-06-15 DIAGNOSIS — Z1231 Encounter for screening mammogram for malignant neoplasm of breast: Secondary | ICD-10-CM

## 2013-06-27 ENCOUNTER — Ambulatory Visit: Payer: BC Managed Care – PPO

## 2013-09-28 ENCOUNTER — Ambulatory Visit (INDEPENDENT_AMBULATORY_CARE_PROVIDER_SITE_OTHER): Payer: Managed Care, Other (non HMO)

## 2013-09-28 VITALS — BP 137/89 | HR 72 | Resp 16 | Ht 68.0 in | Wt 293.0 lb

## 2013-09-28 DIAGNOSIS — M7730 Calcaneal spur, unspecified foot: Secondary | ICD-10-CM

## 2013-09-28 DIAGNOSIS — M79609 Pain in unspecified limb: Secondary | ICD-10-CM

## 2013-09-28 DIAGNOSIS — M722 Plantar fascial fibromatosis: Secondary | ICD-10-CM

## 2013-09-28 MED ORDER — MELOXICAM 15 MG PO TABS: 15.0000 mg | ORAL_TABLET | Freq: Every day | ORAL | Status: DC

## 2013-09-28 NOTE — Progress Notes (Signed)
   Subjective:    Patient ID: Brooke Ballard, female    DOB: [birth year], 43 y.o.   MRN: [id number]  HPI Comments: "I've had trouble with plantar fasciitis before"  Patient c/o aching plantar heel bilateral, left over right, for several months. She has AM pain. 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 She has tried rolling ice bottle and Aleve. No relief.     Review of Systems  Constitutional: Positive for diaphoresis.  Respiratory: Positive for cough.   Cardiovascular: Positive for palpitations.  Allergic/Immunologic: Positive for environmental allergies.  All other systems reviewed and are negative.      Objective:   Physical Exam Lower extremity objective findings as follows vascular status is intact pedal pulses are palpable epicritic and proprioceptive sensations intact and symmetric bilateral is normal plantar response DTRs not elicited dermatologically skin color pigment normal hair growth absent nails criptotic repeat exam reveals pain on palpation mid band of the plantar fascia from medial arch to medial trochanter tubercle left much more so than right are both painful and symptomatic. There is pain on first up in the morning. X-rays reveal well-developed inferior as well as retrocalcaneal spurring is no signs of fracture no osseous abnormalities dermatologically unremarkable       Assessment & Plan:  Assessment plantar fasciitis/heel spur syndrome left more so than right plan at this time fascial strapping is applied prescription for [facility] is given recommended ice to the area reappointed in 2 weeks for followup may be candidate for orthoses based on progress suggested crocs for around the house no barefoot or flimsy shoes or flip-flops next  Alvan Dame DPM

## 2013-09-28 NOTE — Patient Instructions (Signed)

## 2013-10-16 ENCOUNTER — Ambulatory Visit: Payer: Managed Care, Other (non HMO)

## 2013-11-07 ENCOUNTER — Ambulatory Visit (INDEPENDENT_AMBULATORY_CARE_PROVIDER_SITE_OTHER): Payer: Managed Care, Other (non HMO)

## 2013-11-07 ENCOUNTER — Ambulatory Visit: Payer: Managed Care, Other (non HMO)

## 2013-11-07 VITALS — BP 127/88 | HR 72 | Resp 16

## 2013-11-07 DIAGNOSIS — M79606 Pain in leg, unspecified: Secondary | ICD-10-CM

## 2013-11-07 DIAGNOSIS — M7730 Calcaneal spur, unspecified foot: Secondary | ICD-10-CM

## 2013-11-07 DIAGNOSIS — M722 Plantar fascial fibromatosis: Secondary | ICD-10-CM

## 2013-11-07 DIAGNOSIS — M79609 Pain in unspecified limb: Secondary | ICD-10-CM

## 2013-11-07 MED ORDER — TRIAMCINOLONE ACETONIDE 10 MG/ML IJ SUSP: 10.0000 mg | Freq: Once | INTRAMUSCULAR | Status: DC

## 2013-11-07 NOTE — Progress Notes (Signed)
   Subjective:    Patient ID: Brooke Ballard, female    DOB: June 20, 1970, 43 y.o.   MRN: [id number]  HPI Comments: They are doing horrible      Review of Systems no new findings or systemic changes noted     Objective:   Physical Exam Neurovascular status is intact pedal pulses palpable patient did have improvement for one to 2 days with fascial strapping or get loosened fail to improve as is consistently. Still wearing shoes are too soft or flimsy her shoes she is wearing that fit appropriately and are flexing and mid arch recommended a more stable type shoe in the future at this time patient is a strong candidate for orthoses as her insurance does not cover prescription orthotics the option of the OTC type power step orthotic is offered and patient will try those as alternative to a prescription for custom orthotic in the interim patient also has significant pain discomfort left more so than right with recalcitrant plantar fasciitis in the past she's had one steroid injection her right foot at this time my recommendation and patient agreed to the injection 10 mg Kenalog 20 mg Xylocaine plain and 0.5% Marcaine plain 10 mg are infiltrated to the inferior plantar fascia and calcaneal tubercle area bilateral.       Assessment & Plan:  Assessment plantar fasciitis/heel spur syndrome bilateral left more so than right at this time injections Kenalog to both heels recommended ice at home continue with NSAID therapy as needed also maintain a good stable shoe and orthotics are dispensed with break in wearing instructions at this time OTC power step orthotics are dispensed and will be recheck within 1-2 months for adjustments as needed in the future. Maintain good stable shoe in place every evening  Alvan Dame DPM

## 2013-11-07 NOTE — Patient Instructions (Signed)
WEARING INSTRUCTIONS FOR ORTHOTICS  Don't expect to be comfortable wearing your orthotic devices for the first time.  Like eyeglasses, you may be aware of them as time passes, they will not be uncomfortable and you will enjoy wearing them.  FOLLOW THESE INSTRUCTIONS EXACTLY!  1. Wear your orthotic devices for:       Not more than 1 hour the first day.       Not more than 2 hours the second day.       Not more than 3 hours the third day and so on.        Or wear them for as long as they feel comfortable.       If you experience discomfort in your feet or legs take them out.  When feet & legs feel       better, put them back in.  You do need to be consistent and wear them a little        everyday. 2.   If at any time the orthotic devices become acutely uncomfortable before the       time for that particular day, STOP WEARING THEM. 3.   On the next day, do not increase the wearing time. 4.   Subsequently, increase the wearing time by 15-30 minutes only if comfortable to do       so. 5.   You will be seen by your doctor about 2-4 weeks after you receive your orthotic       devices, at which time you will probably be wearing your devices comfortably        for about 8 hours or more a day. 6.   Some patients occasionally report mild aches or discomfort in other parts of the of       body such as the knees, hips or back after 3 or 4 consecutive hours of wear.  If this       is the case with you, do not extend your wearing time.  Instead, cut it back an hour or       two.  In all likelihood, these symptoms will disappear in a short period of time as your       body posture realigns itself and functions more efficiently. 7.   It is possible that your orthotic device may require some small changes or adjustment       to improve their function or make them more comfortable.   This is usually not done       before one to three months have elapsed.  These adjustments are made in        accordance  with the changed position your feet are assuming as a result of       improved biomechanical function. 8.   In women's shoes, it's not unusual for your heel to slip out of the shoe, particularly if       they are step-in-shoes.  If this is the case, try other shoes or other styles.  Try to       purchase shoes which have deeper heal seats or higher heel counters. 9.   Squeaking of orthotics devices in the shoes is due to the movement of the devices       when they are functioning normally.  To eliminate squeaking, simply dust some       baby powder into your shoes before inserting the devices.  If this does not work,          apply soap or wax to the edges of the orthotic devices or put a tissue into the shoes. 10. It is important that you follow these directions explicitly.  Failure to do so will simply       prolong the adjustment period or create problems which are easily avoided.  It makes       no difference if you are wearing your orthotic devices for only a few hours after        several months, so long as you are wearing them comfortably for those hours. 11. If you have any questions or complaints, contact our office.  We have no way of       knowing about your problems unless you tell us.  If we do not hear from you, we will       assume that you are proceeding well.     ICE INSTRUCTIONS  Apply ice or cold pack to the affected area at least 3 times a day for 10-15 minutes each time.  You should also use ice after prolonged activity or vigorous exercise.  Do not apply ice longer than 20 minutes at one time.  Always keep a cloth between your skin and the ice pack to prevent burns.  Being consistent and following these instructions will help control your symptoms.  We suggest you purchase a gel ice pack because they are reusable and do bit leak.  Some of them are designed to wrap around the area.  Use the method that works best for you.  Here are some other suggestions for icing.   Use a  frozen bag of peas or corn-inexpensive and molds well to your body, usually stays frozen for 10 to 20 minutes.  Wet a towel with cold water and squeeze out the excess until it's damp.  Place in a bag in the freezer for 20 minutes. Then remove and use. 

## 2014-03-20 ENCOUNTER — Encounter: Payer: Self-pay | Admitting: Internal Medicine

## 2014-03-20 ENCOUNTER — Ambulatory Visit: Payer: BC Managed Care – PPO | Attending: Internal Medicine | Admitting: Internal Medicine

## 2014-03-20 VITALS — BP 154/99 | HR 83 | Temp 98.2°F | Resp 18 | Ht 68.5 in | Wt 293.0 lb

## 2014-03-20 DIAGNOSIS — F17210 Nicotine dependence, cigarettes, uncomplicated: Secondary | ICD-10-CM | POA: Insufficient documentation

## 2014-03-20 DIAGNOSIS — Z720 Tobacco use: Secondary | ICD-10-CM

## 2014-03-20 DIAGNOSIS — J069 Acute upper respiratory infection, unspecified: Secondary | ICD-10-CM | POA: Insufficient documentation

## 2014-03-20 DIAGNOSIS — Z79899 Other long term (current) drug therapy: Secondary | ICD-10-CM | POA: Insufficient documentation

## 2014-03-20 DIAGNOSIS — F17200 Nicotine dependence, unspecified, uncomplicated: Secondary | ICD-10-CM

## 2014-03-20 DIAGNOSIS — R7309 Other abnormal glucose: Secondary | ICD-10-CM | POA: Insufficient documentation

## 2014-03-20 DIAGNOSIS — F329 Major depressive disorder, single episode, unspecified: Secondary | ICD-10-CM | POA: Insufficient documentation

## 2014-03-20 DIAGNOSIS — I10 Essential (primary) hypertension: Secondary | ICD-10-CM | POA: Insufficient documentation

## 2014-03-20 MED ORDER — AZITHROMYCIN 250 MG PO TABS: ORAL_TABLET | ORAL | Status: DC

## 2014-03-20 MED ORDER — BUPROPION HCL ER (XL) 150 MG PO TB24
150.0000 mg | ORAL_TABLET | Freq: Every day | ORAL | Status: DC
Start: 2014-03-20 — End: 2014-07-18

## 2014-03-20 MED ORDER — VALSARTAN 80 MG PO TABS
80.0000 mg | ORAL_TABLET | Freq: Every day | ORAL | Status: DC
Start: 2014-03-20 — End: 2014-04-04

## 2014-03-20 NOTE — Progress Notes (Signed)
Establish Care Cold Sx.  Complaining of productive cough  Yellow-greenish sputum x 1 week   BP Medicine refill

## 2014-03-20 NOTE — Patient Instructions (Signed)
Smoking Cessation Quitting smoking is important to your health and has many advantages. However, it is not always easy to quit since nicotine is a very addictive drug. Oftentimes, people try 3 times or more before being able to quit. This document explains the best ways for you to prepare to quit smoking. Quitting takes hard work and a lot of effort, but you can do it. ADVANTAGES OF QUITTING SMOKING  You will live longer, feel better, and live better.  Your body will feel the impact of quitting smoking almost immediately.  Within 20 minutes, blood pressure decreases. Your pulse returns to its normal level.  After 8 hours, carbon monoxide levels in the blood return to normal. Your oxygen level increases.  After 24 hours, the chance of having a heart attack starts to decrease. Your breath, hair, and body stop smelling like smoke.  After 48 hours, damaged nerve endings begin to recover. Your sense of taste and smell improve.  After 72 hours, the body is virtually free of nicotine. Your bronchial tubes relax and breathing becomes easier.  After 2 to 12 weeks, lungs can hold more air. Exercise becomes easier and circulation improves.  The risk of having a heart attack, stroke, cancer, or lung disease is greatly reduced.  After 1 year, the risk of coronary heart disease is cut in half.  After 5 years, the risk of stroke falls to the same as a nonsmoker.  After 10 years, the risk of lung cancer is cut in half and the risk of other cancers decreases significantly.  After 15 years, the risk of coronary heart disease drops, usually to the level of a nonsmoker.  If you are pregnant, quitting smoking will improve your chances of having a healthy baby.  The people you live with, especially any children, will be healthier.  You will have extra money to spend on things other than cigarettes. QUESTIONS TO THINK ABOUT BEFORE ATTEMPTING TO QUIT You may want to talk about your answers with your  health care provider.  Why do you want to quit?  If you tried to quit in the past, what helped and what did not?  What will be the most difficult situations for you after you quit? How will you plan to handle them?  Who can help you through the tough times? Your family? Friends? A health care provider?  What pleasures do you get from smoking? What ways can you still get pleasure if you quit? Here are some questions to ask your health care provider:  How can you help me to be successful at quitting?  What medicine do you think would be best for me and how should I take it?  What should I do if I need more help?  What is smoking withdrawal like? How can I get information on withdrawal? GET READY  Set a quit date.  Change your environment by getting rid of all cigarettes, ashtrays, matches, and lighters in your home, car, or work. Do not let people smoke in your home.  Review your past attempts to quit. Think about what worked and what did not. GET SUPPORT AND ENCOURAGEMENT You have a better chance of being successful if you have help. You can get support in many ways.  Tell your family, friends, and coworkers that you are going to quit and need their support. Ask them not to smoke around you.  Get individual, group, or telephone counseling and support. Programs are available at local hospitals and health centers. Call   your local health department for information about programs in your area.  Spiritual beliefs and practices may help some smokers quit.  Download a "quit meter" on your computer to keep track of quit statistics, such as how long you have gone without smoking, cigarettes not smoked, and money saved.  Get a self-help book about quitting smoking and staying off tobacco. LEARN NEW SKILLS AND BEHAVIORS  Distract yourself from urges to smoke. Talk to someone, go for a walk, or occupy your time with a task.  Change your normal routine. Take a different route to work.  Drink tea instead of coffee. Eat breakfast in a different place.  Reduce your stress. Take a hot bath, exercise, or read a book.  Plan something enjoyable to do every day. Reward yourself for not smoking.  Explore interactive web-based programs that specialize in helping you quit. GET MEDICINE AND USE IT CORRECTLY Medicines can help you stop smoking and decrease the urge to smoke. Combining medicine with the above behavioral methods and support can greatly increase your chances of successfully quitting smoking.  Nicotine replacement therapy helps deliver nicotine to your body without the negative effects and risks of smoking. Nicotine replacement therapy includes nicotine gum, lozenges, inhalers, nasal sprays, and skin patches. Some may be available over-the-counter and others require a prescription.  Antidepressant medicine helps people abstain from smoking, but how this works is unknown. This medicine is available by prescription.  Nicotinic receptor partial agonist medicine simulates the effect of nicotine in your brain. This medicine is available by prescription. Ask your health care provider for advice about which medicines to use and how to use them based on your health history. Your health care provider will tell you what side effects to look out for if you choose to be on a medicine or therapy. Carefully read the information on the package. Do not use any other product containing nicotine while using a nicotine replacement product.  RELAPSE OR DIFFICULT SITUATIONS Most relapses occur within the first 3 months after quitting. Do not be discouraged if you start smoking again. Remember, most people try several times before finally quitting. You may have symptoms of withdrawal because your body is used to nicotine. You may crave cigarettes, be irritable, feel very hungry, cough often, get headaches, or have difficulty concentrating. The withdrawal symptoms are only temporary. They are strongest  when you first quit, but they will go away within 10-14 days. To reduce the chances of relapse, try to:  Avoid drinking alcohol. Drinking lowers your chances of successfully quitting.  Reduce the amount of caffeine you consume. Once you quit smoking, the amount of caffeine in your body increases and can give you symptoms, such as a rapid heartbeat, sweating, and anxiety.  Avoid smokers because they can make you want to smoke.  Do not let weight gain distract you. Many smokers will gain weight when they quit, usually less than 10 pounds. Eat a healthy diet and stay active. You can always lose the weight gained after you quit.  Find ways to improve your mood other than smoking. FOR MORE INFORMATION  www.smokefree.gov  Document Released: 04/06/2001 Document Revised: 08/27/2013 Document Reviewed: 07/22/2011 [employer] Patient Information 2015 [employer]. This information is not intended to replace advice given to you by your health care provider. Make sure you discuss any questions you have with your health care provider.  

## 2014-03-20 NOTE — Progress Notes (Signed)
Patient ID: Brooke Ballard, female   DOB: November 30, 1970, 43 y.o.   MRN: [id number]  CC: Establish care  HPI:  Patient presents to clinic today with a past medical history of hypertension and prediabetes.  She currently takes bystolic and azilsartan for hypertension.  She has been on these medications for one month.  She reports that was seeing a hypertension specialist (Dr. Parke SimmersBland) and was placed on these medications.  [facility].  Has been off azilsartan for one week.  She does not have insurance.  She reports that she was not very compliant with medication regimens in the past, so she is unsure if the medications worked for her.  Has quit smoking in past and started back after divorce.  She quit for 13 years.  Did chantix in the past with success but experienced some medication side effects.  She had vivid bad dreams with chantix.  Has a history of depression and currently smokes 1 ppd   Allergies  Allergen Reactions  . Contrast Media [Iodinated Diagnostic Agents] Itching  . Morphine And Related    Past Medical History  Diagnosis Date  . Hypertension   . Borderline diabetes    Current Outpatient Prescriptions on File Prior to Visit  Medication Sig Dispense Refill  . Azilsartan Medoxomil (EDARBI PO) Take by mouth.    . Multiple Vitamin (MULTIVITAMIN WITH MINERALS) TABS Take 1 tablet by mouth daily.    . Nebivolol HCl (BYSTOLIC PO) Take by mouth.    . Vitamin D, Ergocalciferol, (DRISDOL) 50000 UNITS CAPS Take 50,000 Units by mouth every 7 (seven) days. Takes twice a week    . amLODipine (NORVASC) 10 MG tablet Take 10 mg by mouth daily.    . meloxicam (MOBIC) 15 MG tablet Take 1 tablet (15 mg total) by mouth daily. (Patient not taking: Reported on 03/20/2014) 30 tablet 1  . MetFORMIN HCl (GLUMETZA PO) Take by mouth.     Current Facility-Administered Medications on File Prior to Visit  Medication Dose Route Frequency Provider Last Rate Last Dose  . triamcinolone  acetonide (KENALOG) 10 MG/ML injection 10 mg  10 mg Other Once Alvan Dameichard S[place], DPM       Family History  Problem Relation Age of Onset  . Hypertension Mother   . Hypertension Father    History   Social History  . Marital Status: Divorced    Spouse Name: N/A    Number of Children: N/A  . Years of Education: N/A   Occupational History  . Not on file.   Social History Main Topics  . Smoking status: Current Every Day Smoker  . Smokeless tobacco: Not on file  . Alcohol Use: Yes     Comment: social  . Drug Use: No  . Sexual Activity: Not on file   Other Topics Concern  . Not on file   Social History Narrative    Review of Systems: Constitutional: Negative for fever, chills, diaphoresis, activity change, appetite change and fatigue. HENT: Negative for ear pain, nosebleeds, congestion, facial swelling, rhinorrhea, neck pain, neck stiffness and ear discharge.  Eyes: Negative for pain, discharge, redness, itching and visual disturbance. Respiratory: Negative for cough, choking, chest tightness, shortness of breath, wheezing and stridor.  Cardiovascular: Negative for chest pain, palpitations and leg swelling. Gastrointestinal: Negative for abdominal distention. Genitourinary: Negative for dysuria, urgency, frequency, hematuria, flank pain, decreased urine volume, difficulty urinating and dyspareunia.  Musculoskeletal: Negative for back pain, joint swelling, arthralgias and gait problem. Neurological:  Negative for dizziness, tremors, seizures, syncope, facial asymmetry, speech difficulty, weakness, light-headedness, numbness and headaches.  Hematological: Negative for adenopathy. Does not bruise/bleed easily. Psychiatric/Behavioral: Negative for hallucinations, behavioral problems, confusion, dysphoric mood, decreased concentration and agitation.    Objective:   Filed Vitals:   03/20/14 1535  BP: 154/99  Pulse: 83  Temp: 98.2 F (36.8 C)  Resp: 18    Physical  Exam: Constitutional: Patient appears well-developed and well-nourished. No distress. HENT: Normocephalic, atraumatic, External right and left ear normal. Oropharynx is clear and moist.  Eyes: Conjunctivae and EOM are normal. PERRLA, no scleral icterus. Neck: Normal ROM. Neck supple. No JVD. No tracheal deviation. No thyromegaly. CVS: RRR, S1/S2 +, no murmurs, no gallops, no carotid bruit.  Pulmonary: Effort and breath sounds normal, no stridor, rhonchi, wheezes, rales.  Abdominal: Soft. BS +,  no distension, tenderness, rebound or guarding.  Musculoskeletal: Normal range of motion. No edema and no tenderness.  Lymphadenopathy: No lymphadenopathy noted, cervical Neuro: Alert. Normal reflexes, muscle tone coordination. No cranial nerve deficit. Skin: Skin is warm and dry. No rash noted. Not diaphoretic. No erythema. No pallor. Psychiatric: Normal mood and affect. Behavior, judgment, thought content normal.  Lab Results  Component Value Date   WBC 9.7 07/09/2012   HGB 15.3* 07/09/2012   HCT 44.2 07/09/2012   MCV 87.9 07/09/2012   PLT 277 07/09/2012   Lab Results  Component Value Date   CREATININE 0.71 07/09/2012   BUN 9 07/09/2012   NA 138 07/09/2012   K 3.8 07/09/2012   CL 101 07/09/2012   CO2 29 07/09/2012    No results found for: HGBA1C Lipid Panel  No results found for: CHOL, TRIG, HDL, CHOLHDL, VLDL, LDLCALC     Assessment and plan:   Noon was seen today for establish care.  Diagnoses and associated orders for this visit:  Essential hypertension - Begin valsartan (DIOVAN) 80 MG tablet; Take 1 tablet (80 mg total) by mouth daily.  Patient will discontinue all other medications due to cost Patient blood pressure remains elevated today, will increase BP medication and have patient to return in 2 weeks for blood pressure recheck with nurse. Stressed diet changes, regular exercise regimen, and modifiable risk factors. Will follow up with CMP as needed, Will follow up with  patient in 3-6 months.   URI (upper respiratory infection) - azithromycin (ZITHROMAX) 250 MG tablet; Take 2 today and 1 each day after  Smoking - buPROPion (WELLBUTRIN XL) 150 MG 24 hr tablet; Take 1 tablet (150 mg total) by mouth daily. The patient was counseled on the dangers of tobacco use, and was advised to quit.  Reviewed strategies to maximize success, including removing cigarettes and smoking materials from environment, stress management, written materials and pharmacotherapy (Wellbutrin).    Return in about 2 weeks (around 04/03/2014) for Nurse Visit-bp CHECK AND 3 MO pcp. If BP is not well controlled on follow up may increase valsartan at that time      Holland Commons, NP-C [facility] [phone] 03/20/2014, 4:06 PM

## 2014-04-04 ENCOUNTER — Ambulatory Visit: Payer: BC Managed Care – PPO | Attending: Internal Medicine | Admitting: *Deleted

## 2014-04-04 VITALS — BP 130/94 | HR 85 | Temp 98.4°F | Resp 20

## 2014-04-04 DIAGNOSIS — Z79899 Other long term (current) drug therapy: Secondary | ICD-10-CM | POA: Insufficient documentation

## 2014-04-04 DIAGNOSIS — F17210 Nicotine dependence, cigarettes, uncomplicated: Secondary | ICD-10-CM | POA: Insufficient documentation

## 2014-04-04 DIAGNOSIS — I10 Essential (primary) hypertension: Secondary | ICD-10-CM | POA: Insufficient documentation

## 2014-04-04 MED ORDER — VALSARTAN 160 MG PO TABS: 160.0000 mg | ORAL_TABLET | Freq: Every day | ORAL | Status: DC

## 2014-04-04 NOTE — Progress Notes (Signed)
Patient presents for BP check Med list reviewed; states taking valsartan as directed. Smoking 1 ppd; Had cigarette 20 minutes before BP reading. States will pick up rx for wellbutrin today to assist with quitting smoking States trying to lower sodium content in diet. Discussed Mrs. Dash as alternative. States drinking more water, eating more fruits and vegetables and  began exercise regimen last pm (cardio) Discussed walking for exercise as well States not taking metformin. States took for 2 days only and didn't like the way it made her feel.   BP 130/94 P 85 R 20  T  98.4 oral SPO2  97%  Per PCP: Increase valsartan to 160 mg daily Return in 2 weeks for nurse visit for BP check Will discuss metformin and prediabetes at next office visit  Patient advised to call for med refills at least 7 days before running out so as not to go without. Patient aware that she is to f/u with PCP 3 months from last visit (due 06/20/14)

## 2014-05-02 ENCOUNTER — Ambulatory Visit: Payer: Managed Care, Other (non HMO) | Attending: Internal Medicine | Admitting: *Deleted

## 2014-05-02 VITALS — BP 142/100 | HR 72 | Temp 98.1°F | Resp 16

## 2014-05-02 DIAGNOSIS — I10 Essential (primary) hypertension: Secondary | ICD-10-CM | POA: Insufficient documentation

## 2014-05-02 DIAGNOSIS — R030 Elevated blood-pressure reading, without diagnosis of hypertension: Secondary | ICD-10-CM

## 2014-05-02 DIAGNOSIS — IMO0001 Reserved for inherently not codable concepts without codable children: Secondary | ICD-10-CM

## 2014-05-02 MED ORDER — CLONIDINE HCL 0.1 MG PO TABS
0.1000 mg | ORAL_TABLET | Freq: Once | ORAL | Status: AC
  Administered 2014-05-02: 0.1 mg via ORAL

## 2014-05-02 NOTE — Progress Notes (Signed)
Patient presents for BP check Med list reviewed; states ran out of diovan for 7 days but restarted 2 nights ago Denies headache, blurred vision, chest pain or pressure  BP 156/110 left arm manually with large cuff P 76 R  16 T  98.1 oral SPO2  97%  Per PCP: Clonidine 0.1 mg once now Return next week for nurse visit for BP check after being on diovan continuously  BP 142/100 P 72 30 minutes after clonidine administration  Patient advised to call for med refills at least 7 days before running out so as not to go without. Patient aware that she is to f/u with PCP 3 months from last visit (Due 06/20/14)

## 2014-05-31 ENCOUNTER — Other Ambulatory Visit: Payer: Self-pay | Admitting: Internal Medicine

## 2014-06-03 ENCOUNTER — Ambulatory Visit: Payer: Managed Care, Other (non HMO) | Admitting: Internal Medicine

## 2014-06-03 ENCOUNTER — Other Ambulatory Visit: Payer: Self-pay | Admitting: Emergency Medicine

## 2014-06-03 DIAGNOSIS — I10 Essential (primary) hypertension: Secondary | ICD-10-CM

## 2014-06-03 MED ORDER — VALSARTAN 160 MG PO TABS: 160.0000 mg | ORAL_TABLET | Freq: Every day | ORAL | Status: DC

## 2014-07-18 ENCOUNTER — Encounter: Payer: Self-pay | Admitting: Internal Medicine

## 2014-07-18 ENCOUNTER — Ambulatory Visit: Payer: 59 | Attending: Internal Medicine | Admitting: Internal Medicine

## 2014-07-18 VITALS — BP 125/84 | HR 69 | Temp 98.6°F | Resp 16 | Ht 68.0 in | Wt 298.0 lb

## 2014-07-18 DIAGNOSIS — F17210 Nicotine dependence, cigarettes, uncomplicated: Secondary | ICD-10-CM | POA: Diagnosis not present

## 2014-07-18 DIAGNOSIS — Z720 Tobacco use: Secondary | ICD-10-CM

## 2014-07-18 DIAGNOSIS — I10 Essential (primary) hypertension: Secondary | ICD-10-CM | POA: Diagnosis not present

## 2014-07-18 DIAGNOSIS — F17200 Nicotine dependence, unspecified, uncomplicated: Secondary | ICD-10-CM | POA: Insufficient documentation

## 2014-07-18 DIAGNOSIS — E669 Obesity, unspecified: Secondary | ICD-10-CM | POA: Insufficient documentation

## 2014-07-18 LAB — LIPID PANEL
CHOL/HDL RATIO: 5.6 ratio
Cholesterol: 178 mg/dL (ref 0–200)
HDL: 32 mg/dL — AB (ref >=46)
LDL CALC: 124 mg/dL — AB (ref 0–99)
TRIGLYCERIDES: 108 mg/dL (ref <150)
VLDL: 22 mg/dL (ref 0–40)

## 2014-07-18 LAB — COMPLETE METABOLIC PANEL WITH GFR
ALBUMIN: 3.7 g/dL (ref 3.5–5.2)
ALK PHOS: 47 U/L (ref 39–117)
ALT: 23 U/L (ref 0–35)
AST: 16 U/L (ref 0–37)
BUN: 13 mg/dL (ref 6–23)
CHLORIDE: 105 meq/L (ref 96–112)
CO2: 24 meq/L (ref 19–32)
Calcium: 9.2 mg/dL (ref 8.4–10.5)
Creat: 0.76 mg/dL (ref 0.50–1.10)
GFR, Est African American: >89 mL/min
Glucose, Bld: 89 mg/dL (ref 70–99)
POTASSIUM: 5.1 meq/L (ref 3.5–5.3)
Sodium: 139 mEq/L (ref 135–145)
TOTAL PROTEIN: 6.2 g/dL (ref 6.0–8.3)
Total Bilirubin: 0.4 mg/dL (ref 0.2–1.2)

## 2014-07-18 LAB — HEMOGLOBIN A1C
Hgb A1c MFr Bld: 6.1 % — ABNORMAL HIGH (ref <5.7)
MEAN PLASMA GLUCOSE: 128 mg/dL — AB (ref <117)

## 2014-07-18 LAB — CBC
HEMATOCRIT: 43 % (ref 36.0–46.0)
HEMOGLOBIN: 14.6 g/dL (ref 12.0–15.0)
MCH: 30.4 pg (ref 26.0–34.0)
MCHC: 34 g/dL (ref 30.0–36.0)
MCV: 89.4 fL (ref 78.0–100.0)
MPV: 10.4 fL (ref 8.6–12.4)
Platelets: 264 10*3/uL (ref 150–400)
RBC: 4.81 MIL/uL (ref 3.87–5.11)
RDW: 13.6 % (ref 11.5–15.5)
WBC: 9.1 10*3/uL (ref 4.0–10.5)

## 2014-07-18 MED ORDER — VALSARTAN 160 MG PO TABS: 160.0000 mg | ORAL_TABLET | Freq: Every day | ORAL | Status: DC

## 2014-07-18 NOTE — Progress Notes (Signed)
Patient to follow up on HTN Ran out of her meds in January Patient reports her old doctor gave her some type of HTN med that she had left over and she did take those  Patient reports no pap in years (last one was normal) Mammogram normal last year   BP today 125/84 Pule 69

## 2014-07-18 NOTE — Progress Notes (Signed)
Patient ID: Brooke Ballard, female   DOB: December 01, 1970, 44 y.o.   MRN: [id number] Subjective:  Brooke Ballard is a 44 y.o. female with hypertension. Current Outpatient Prescriptions  Medication Sig Dispense Refill  . Multiple Vitamin (MULTIVITAMIN WITH MINERALS) TABS Take 1 tablet by mouth daily.    . valsartan (DIOVAN) 160 MG tablet Take 1 tablet (160 mg total) by mouth daily. 30 tablet 2  . Vitamin D, Ergocalciferol, (DRISDOL) 50000 UNITS CAPS Take 50,000 Units by mouth every 7 (seven) days. Takes twice a week     Current Facility-Administered Medications  Medication Dose Route Frequency Provider Last Rate Last Dose  . triamcinolone acetonide (KENALOG) 10 MG/ML injection 10 mg  10 mg Other Once Alvan Dame, DPM        Hypertension ROS: taking medications as instructed, no medication side effects noted, no TIA's, no chest pain on exertion, no dyspnea on exertion, no swelling of ankles and no palpitations.  New concerns: Patient reports that she ran out of her blood pressure medication 2 months ago and began taking sample of Bystolic given to her by her old PCP. She reports a mild cough since beginning the medication.   Objective:  BP 125/84 mmHg  Pulse 69  Temp(Src) 98.6 F (37 C)  Resp 16  Ht 5\' 8"  (1.727 m)  Wt 298 lb (135.172 kg)  BMI 45.32 kg/m2  SpO2 98%  LMP 07/09/2012  Appearance alert, well appearing, and in no distress, oriented to person, place, and time and overweight. General exam BP noted to be well controlled today in office, S1, S2 normal, no gallop, no murmur, chest clear, no JVD, no HSM, no edema, CVS exam  - normal rate, regular rhythm, normal S1, S2, no murmurs, rubs, clicks or gallops, no murmurs noted, no gallops noted, no JVD.  Lab review: orders written for new lab studies as appropriate; see orders.   Assessment:   Hypertension needs further observation and needs to quit smoking.   Plan:  Recommended sodium restriction. Very strongly urged to quit smoking to  reduce cardiovascular risk. Patient will go back on Valsartan due to cough from samples of bystolic. Will recheck in 2 weeks.   Brooke Ballard was seen today for hypertension and follow-up.  Diagnoses and all orders for this visit:  Essential hypertension Orders: -     Discontinue: valsartan (DIOVAN) 160 MG tablet; Take 1 tablet (160 mg total) by mouth daily. -     Lipid panel -     COMPLETE METABOLIC PANEL WITH GFR -     CBC -     HIV antibody (with reflex)  Obesity Orders: -     Hemoglobin A1c Weight loss discussed at length and its complications to health.  Patient will loss 10 months by next visit in 3 months.  Diet and exercise discussed as well as calorie intake.  Tobacco use disorder Smoking cessation discussed for 3 minutes, patient is not willing to quit at this time. Will continue to assess on each visit. Discussed increased risk for diseases such as cancer, heart disease, and stroke.    Return in about 2 weeks (around 08/01/2014) for Nurse Visit-BP checks and 3 mo PCP.  Holland Commons, NP 07/18/2014 10:37 AM

## 2014-07-18 NOTE — Patient Instructions (Signed)
DASH Eating Plan °DASH stands for "Dietary Approaches to Stop Hypertension." The DASH eating plan is a healthy eating plan that has been shown to reduce high blood pressure (hypertension). Additional health benefits may include reducing the risk of type 2 diabetes mellitus, heart disease, and stroke. The DASH eating plan may also help with weight loss. °WHAT DO I NEED TO KNOW ABOUT THE DASH EATING PLAN? °For the DASH eating plan, you will follow these general guidelines: °· Choose foods with a percent daily value for sodium of less than 5% (as listed on the food label). °· Use salt-free seasonings or herbs instead of table salt or sea salt. °· Check with your health care provider or pharmacist before using salt substitutes. °· Eat lower-sodium products, often labeled as "lower sodium" or "no salt added." °· Eat fresh foods. °· Eat more vegetables, fruits, and low-fat dairy products. °· Choose whole grains. Look for the word "whole" as the first word in the ingredient list. °· Choose fish and skinless chicken or turkey more often than red meat. Limit fish, poultry, and meat to 6 oz (170 g) each day. °· Limit sweets, desserts, sugars, and sugary drinks. °· Choose heart-healthy fats. °· Limit cheese to 1 oz (28 g) per day. °· Eat more home-cooked food and less restaurant, buffet, and fast food. °· Limit fried foods. °· Cook foods using methods other than frying. °· Limit canned vegetables. If you do use them, rinse them well to decrease the sodium. °· When eating at a restaurant, ask that your food be prepared with less salt, or no salt if possible. °WHAT FOODS CAN I EAT? °Seek help from a dietitian for individual calorie needs. °Grains °Whole grain or whole wheat bread. Brown rice. Whole grain or whole wheat pasta. Quinoa, bulgur, and whole grain cereals. Low-sodium cereals. Corn or whole wheat flour tortillas. Whole grain cornbread. Whole grain crackers. Low-sodium crackers. °Vegetables °Fresh or frozen vegetables  (raw, steamed, roasted, or grilled). Low-sodium or reduced-sodium tomato and vegetable juices. Low-sodium or reduced-sodium tomato sauce and paste. Low-sodium or reduced-sodium canned vegetables.  °Fruits °All fresh, canned (in natural juice), or frozen fruits. °Meat and Other Protein Products °Ground beef (85% or leaner), grass-fed beef, or beef trimmed of fat. Skinless chicken or turkey. Ground chicken or turkey. Pork trimmed of fat. All fish and seafood. Eggs. Dried beans, peas, or lentils. Unsalted nuts and seeds. Unsalted canned beans. °Dairy °Low-fat dairy products, such as skim or 1% milk, 2% or reduced-fat cheeses, low-fat ricotta or cottage cheese, or plain low-fat yogurt. Low-sodium or reduced-sodium cheeses. °Fats and Oils °Tub margarines without trans fats. Light or reduced-fat mayonnaise and salad dressings (reduced sodium). Avocado. Safflower, olive, or canola oils. Natural peanut or almond butter. °Other °Unsalted popcorn and pretzels. °The items listed above may not be a complete list of recommended foods or beverages. Contact your dietitian for more options. °WHAT FOODS ARE NOT RECOMMENDED? °Grains °White bread. White pasta. White rice. Refined cornbread. Bagels and croissants. Crackers that contain trans fat. °Vegetables °Creamed or fried vegetables. Vegetables in a cheese sauce. Regular canned vegetables. Regular canned tomato sauce and paste. Regular tomato and vegetable juices. °Fruits °Dried fruits. Canned fruit in light or heavy syrup. Fruit juice. °Meat and Other Protein Products °Fatty cuts of meat. Ribs, chicken wings, bacon, sausage, bologna, salami, chitterlings, fatback, hot dogs, bratwurst, and packaged luncheon meats. Salted nuts and seeds. Canned beans with salt. °Dairy °Whole or 2% milk, cream, half-and-half, and cream cheese. Whole-fat or sweetened yogurt. Full-fat   cheeses or blue cheese. Nondairy creamers and whipped toppings. Processed cheese, cheese spreads, or cheese  curds. °Condiments °Onion and garlic salt, seasoned salt, table salt, and sea salt. Canned and packaged gravies. Worcestershire sauce. Tartar sauce. Barbecue sauce. Teriyaki sauce. Soy sauce, including reduced sodium. Steak sauce. Fish sauce. Oyster sauce. Cocktail sauce. Horseradish. Ketchup and mustard. Meat flavorings and tenderizers. Bouillon cubes. Hot sauce. Tabasco sauce. Marinades. Taco seasonings. Relishes. °Fats and Oils °Butter, stick margarine, lard, shortening, ghee, and bacon fat. Coconut, palm kernel, or palm oils. Regular salad dressings. °Other °Pickles and olives. Salted popcorn and pretzels. °The items listed above may not be a complete list of foods and beverages to avoid. Contact your dietitian for more information. °WHERE CAN I FIND MORE INFORMATION? °[employer], [employer]: www.nhlbi.nih.gov/health/health-topics/topics/dash/ °Document Released: 04/01/2011 Document Revised: 08/27/2013 Document Reviewed: 02/14/2013 °ExitCare® Patient Information ©2015 [employer]. This information is not intended to replace advice given to you by your health care provider. Make sure you discuss any questions you have with your health care provider. ° °

## 2014-07-19 LAB — HIV ANTIBODY (ROUTINE TESTING W REFLEX): HIV 1&2 Ab, 4th Generation: NONREACTIVE

## 2014-07-30 ENCOUNTER — Telehealth: Payer: Self-pay | Admitting: *Deleted

## 2014-07-30 NOTE — Telephone Encounter (Signed)
-----   Message from Ambrose Finland, NP sent at 07/25/2014 11:04 PM EDT ----- Labs are within normal limits except Cholesterol slightly elevated. Please provide appropriate education regarding diet and exercise.

## 2014-07-30 NOTE — Telephone Encounter (Signed)
Pt is aware of her lab results.  

## 2014-08-10 ENCOUNTER — Emergency Department (HOSPITAL_COMMUNITY)
Admission: EM | Admit: 2014-08-10 | Discharge: 2014-08-10 | Disposition: A | Discharge status: Home / Self Care | Payer: 59 | Source: Home / Self Care | Attending: Family Medicine | Admitting: Family Medicine

## 2014-08-10 ENCOUNTER — Encounter (HOSPITAL_COMMUNITY): Payer: Self-pay | Admitting: *Deleted

## 2014-08-10 DIAGNOSIS — J40 Bronchitis, not specified as acute or chronic: Principal | ICD-10-CM

## 2014-08-10 DIAGNOSIS — J410 Simple chronic bronchitis: Secondary | ICD-10-CM | POA: Diagnosis not present

## 2014-08-10 DIAGNOSIS — Z720 Tobacco use: Secondary | ICD-10-CM

## 2014-08-10 MED ORDER — DOXYCYCLINE HYCLATE 100 MG PO CAPS: 100.0000 mg | ORAL_CAPSULE | Freq: Two times a day (BID) | ORAL | Status: DC

## 2014-08-10 NOTE — ED Provider Notes (Signed)
CSN: [id number]     Arrival date & time 08/10/14  1414 History   First MD Initiated Contact with Patient 08/10/14 1500     Chief Complaint  Patient presents with  . Cough   (Consider location/radiation/quality/duration/timing/severity/associated sxs/prior Treatment) Patient is a 44 y.o. female presenting with cough. The history is provided by the patient.  Cough Cough characteristics:  Productive Sputum characteristics:  Yellow and green Severity:  Moderate Onset quality:  Gradual Duration:  10 days Progression:  Unchanged Chronicity:  New Smoker: yes   Context: upper respiratory infection and weather changes   Relieved by:  None tried Worsened by:  Nothing tried Ineffective treatments:  None tried Associated symptoms: no chills, no fever, no rhinorrhea, no shortness of breath and no wheezing     Past Medical History  Diagnosis Date  . Hypertension   . Borderline diabetes    Past Surgical History  Procedure Laterality Date  . Abdominal hysterectomy    . Back surgery     Family History  Problem Relation Age of Onset  . Hypertension Mother   . Hypertension Father    History  Substance Use Topics  . Smoking status: Current Every Day Smoker -- 1.00 packs/day  . Smokeless tobacco: Not on file     Comment: Smoking 1 ppd  . Alcohol Use: 0.0 oz/week    0 Standard drinks or equivalent per week     Comment: social   OB History    No data available     Review of Systems  Constitutional: Negative.  Negative for fever and chills.  HENT: Negative for rhinorrhea.   Respiratory: Positive for cough. Negative for shortness of breath and wheezing.   Genitourinary: Negative.     Allergies  Contrast media and Morphine and related  Home Medications   Prior to Admission medications   Medication Sig Start Date End Date Taking? Authorizing Provider  doxycycline (VIBRAMYCIN) 100 MG capsule Take 1 capsule (100 mg total) by mouth 2 (two) times daily. 08/10/14   Linna Hoff, MD   Multiple Vitamin (MULTIVITAMIN WITH MINERALS) TABS Take 1 tablet by mouth daily.    Historical Provider, MD  valsartan (DIOVAN) 160 MG tablet Take 1 tablet (160 mg total) by mouth daily. 07/18/14   Ambrose Finland, NP  Vitamin D, Ergocalciferol, (DRISDOL) 50000 UNITS CAPS Take 50,000 Units by mouth every 7 (seven) days. Takes twice a week    Historical Provider, MD   BP 176/127 mmHg  Pulse 81  Temp(Src) 98.2 F (36.8 C) (Oral)  Resp 20  SpO2 96%  LMP 07/09/2012 Physical Exam  Constitutional: She appears well-developed and well-nourished. No distress.  Cardiovascular: Regular rhythm, normal heart sounds and intact distal pulses.   Pulmonary/Chest: Effort normal. She has rhonchi. She has no rales.  Nursing note and vitals reviewed.   ED Course  Procedures (including critical care time) Labs Review Labs Reviewed - No data to display  Imaging Review No results found.   MDM   1. Bronchitis due to tobacco use        Linna Hoff, MD 08/10/14 1520

## 2014-08-10 NOTE — Discharge Instructions (Signed)
Take all of medicine, drink lots of fluids, no more smoking, see your doctor if further problems  °

## 2014-08-10 NOTE — ED Notes (Signed)
Pt  Reports  Symptoms  Of  A  Cough     - pt  Is  A  Smoker         Symptoms  X   10  Days           Pt  Ambulated  To  Room  With a  Slow  Steady  Gait  Speaking in  Complete  sentances      Skin is  Warm  And  Dry

## 2014-10-12 ENCOUNTER — Emergency Department (HOSPITAL_COMMUNITY)
Admission: EM | Admit: 2014-10-12 | Discharge: 2014-10-12 | Disposition: A | Discharge status: Home / Self Care | Payer: 59 | Attending: Emergency Medicine | Admitting: Emergency Medicine

## 2014-10-12 ENCOUNTER — Encounter (HOSPITAL_COMMUNITY): Payer: Self-pay | Admitting: Emergency Medicine

## 2014-10-12 DIAGNOSIS — Z720 Tobacco use: Secondary | ICD-10-CM | POA: Insufficient documentation

## 2014-10-12 DIAGNOSIS — Z791 Long term (current) use of non-steroidal anti-inflammatories (NSAID): Secondary | ICD-10-CM | POA: Insufficient documentation

## 2014-10-12 DIAGNOSIS — I471 Supraventricular tachycardia: Secondary | ICD-10-CM | POA: Insufficient documentation

## 2014-10-12 DIAGNOSIS — Z79899 Other long term (current) drug therapy: Secondary | ICD-10-CM | POA: Diagnosis not present

## 2014-10-12 DIAGNOSIS — I10 Essential (primary) hypertension: Secondary | ICD-10-CM | POA: Insufficient documentation

## 2014-10-12 DIAGNOSIS — R000 Tachycardia, unspecified: Secondary | ICD-10-CM | POA: Diagnosis present

## 2014-10-12 MED ORDER — METOPROLOL TARTRATE 25 MG PO TABS: 25.0000 mg | ORAL_TABLET | Freq: Once | ORAL | Status: DC | PRN

## 2014-10-12 NOTE — Discharge Instructions (Signed)
Supraventricular Tachycardia °Supraventricular tachycardia (SVT) is an abnormal heart rhythm (arrhythmia) that causes the heart to beat very fast (tachycardia). This kind of fast heartbeat originates in the upper chambers of the heart (atria). SVT can cause the heart to beat greater than 100 beats per minute. SVT can have a rapid burst of heartbeats. This can start and stop suddenly without warning and is called nonsustained. SVT can also be sustained, in which the heart beats at a continuous fast rate.  °CAUSES  °There can be different causes of SVT. Some of these include: °· Heart valve problems such as mitral valve prolapse. °· An enlarged heart (hypertrophic cardiomyopathy). °· Congenital heart problems. °· Heart inflammation (pericarditis). °· Hyperthyroidism. °· Low potassium or magnesium levels. °· Caffeine. °· Drug use such as cocaine, methamphetamines, or stimulants. °· Some over-the-counter medicines such as: °¨ Decongestants. °¨ Diet medicines. °¨ Herbal medicines. °SYMPTOMS  °Symptoms of SVT can vary. Symptoms depend on whether the SVT is sustained or nonsustained. You may experience: °· No symptoms (asymptomatic). °· An awareness of your heart beating rapidly (palpitations). °· Shortness of breath. °· Chest pain or pressure. °If your blood pressure drops because of the SVT, you may experience: °· Fainting or near fainting. °· Weakness. °· Dizziness. °DIAGNOSIS  °Different tests can be performed to diagnose SVT, such as: °· An electrocardiogram (EKG). This is a painless test that records the electrical activity of your heart. °· Holter monitor. This is a 24 hour recording of your heart rhythm. You will be given a diary. Write down all symptoms that you have and what you were doing at the time you experienced symptoms. °· Arrhythmia monitor. This is a small device that your wear for several weeks. It records the heart rhythm when you have symptoms. °· Echocardiogram. This is an imaging test to help detect  abnormal heart structure such as congenital abnormalities, heart valve problems, or heart enlargement. °· Stress test. This test can help determine if the SVT is related to exercise. °· Electrophysiology study (EPS). This is a procedure that evaluates your heart's electrical system and can help your caregiver find the cause of your SVT. °TREATMENT  °Treatment of SVT depends on the symptoms, how often it recurs, and whether there are any underlying heart problems.  °· If symptoms are rare and no other cardiac disease is present, no treatment may be needed. °· Blood work may be done to check potassium, magnesium, and thyroid hormone levels to see if they are abnormal. If these levels are abnormal, treatment to correct the problems will occur. °Medicines °Your caregiver may use oral medicines to treat SVT. These medicines are given for long-term control of SVT. Medicines may be used alone or in combination with other treatments. These medicines work to slow nerve impulses in the heart muscle. These medicines can also be used to treat high blood pressure. Some of these medicines may include: °· Calcium channel blockers. °· Beta blockers. °· Digoxin. °Nonsurgical procedures °Nonsurgical techniques may be used if oral medicines do not work. Some examples include: °· Cardioversion. This technique uses either drugs or an electrical shock to restore a normal heart rhythm. °¨ Cardioversion drugs may be given through an intravenous (IV) line to help "reset" the heart rhythm. °¨ In electrical cardioversion, the caregiver shocks your heart to stop its beat for a split second. This helps to reset the heart to a normal rhythm. °· Ablation. This procedure is done under mild sedation. High frequency radio wave energy is used to   destroy the area of heart tissue responsible for the SVT. °HOME CARE INSTRUCTIONS  °· Do not smoke. °· Only take medicines prescribed by your caregiver. Check with your caregiver before using over-the-counter  medicines. °· Check with your caregiver about how much alcohol and caffeine (coffee, tea, colas, or chocolate) you may have. °· It is very important to keep all follow-up referrals and appointments in order to properly manage this problem. °SEEK IMMEDIATE MEDICAL CARE IF: °· You have dizziness. °· You faint or nearly faint. °· You have shortness of breath. °· You have chest pain or pressure. °· You have sudden nausea or vomiting. °· You have profuse sweating. °· You are concerned about how long your symptoms last. °· You are concerned about the frequency of your SVT episodes. °If you have the above symptoms, call your local emergency services (911 in [place] immediately. Do not drive yourself to the hospital. °MAKE SURE YOU:  °· Understand these instructions. °· Will watch your condition. °· Will get help right away if you are not doing well or get worse. °Document Released: 04/12/2005 Document Revised: 07/05/2011 Document Reviewed: 07/25/2008 °ExitCare® Patient Information ©2015 [employer]. This information is not intended to replace advice given to you by your health care provider. Make sure you discuss any questions you have with your health care provider. ° °

## 2014-10-12 NOTE — ED Notes (Signed)
Per EMS, pt coming in for evaluation of SVT. Pt started feeling pressure and her heart racing 1 hour ago. EMS attempted vagal maneuvers x2 with no change. EMS then gave 6mg  of adenosine which converted her back into sinus rhythm with a rate of 108. Pt reports relief of pain. Pt alert x4. NAD at this time.

## 2014-10-12 NOTE — ED Provider Notes (Signed)
CSN: [id number]     Arrival date & time    History   First MD Initiated Contact with Patient 10/12/14 1711     Chief Complaint  Patient presents with  . Tachycardia     (Consider location/radiation/quality/duration/timing/severity/associated sxs/prior Treatment) Patient is a 44 y.o. female presenting with palpitations. The history is provided by the patient.  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 Hypertension   . Borderline diabetes    Past Surgical History  Procedure Laterality Date  . Abdominal hysterectomy    . Back surgery     Family History  Problem Relation Age of Onset  . Hypertension Mother   . Hypertension Father    History  Substance Use Topics  . Smoking status: Current Every Day Smoker -- 1.00 packs/day  . Smokeless tobacco: Not on file     Comment: Smoking 1 ppd  . Alcohol Use: 0.0 oz/week    0 Standard drinks or equivalent per week     Comment: social   OB History    No data available     Review of Systems  Respiratory: Negative for cough and shortness of breath.   Cardiovascular: Positive for palpitations. Negative for chest pain and syncope.  Neurological: Negative for weakness.  All other systems reviewed and are negative.     Allergies  Other; Contrast media; and Morphine and related  Home Medications   Prior to Admission  medications   Medication Sig Start Date End Date Taking? Authorizing Provider  amLODipine (NORVASC) 10 MG tablet Take 10 mg by mouth daily. 10/11/14  Yes Historical Provider, MD  Multiple Vitamin (MULTIVITAMIN) LIQD Take 15 mLs by mouth daily.   Yes Historical Provider, MD  naproxen sodium (ANAPROX) 220 MG tablet Take 220 mg by mouth 2 (two) times daily as needed (FOR PAIN).   Yes Historical Provider, MD  doxycycline (VIBRAMYCIN) 100 MG capsule Take 1 capsule (100 mg total) by mouth 2 (two) times daily. 08/10/14   Linna Hoff, MD  metoprolol (LOPRESSOR) 25 MG tablet Take 1 tablet (25 mg total) by mouth once as needed (palpitations). 10/12/14   Lyndal Pulley, MD  valsartan (DIOVAN) 160 MG tablet Take 1 tablet (160 mg total) by mouth daily. 07/18/14   Ambrose Finland, NP   BP 130/76 mmHg  Pulse 88  Temp(Src) 98.7 F (37.1 C)  Resp 12  SpO2 100%  LMP 07/09/2012 Physical Exam  Constitutional: She is oriented to person, place, and time. She appears well-developed and well-nourished. No distress.  HENT:  Head: Normocephalic.  Eyes: Conjunctivae are normal.  Neck: Neck supple. No tracheal deviation present.  Cardiovascular: Normal rate and regular rhythm.   Pulmonary/Chest: Effort normal. No respiratory distress.  Abdominal: Soft. She exhibits no distension.  Neurological: She is alert and oriented to person, place, and time.  Skin: Skin is warm and dry.  Psychiatric: She has a normal mood and affect.  ED Course  Procedures (including critical care time) Labs Review Labs Reviewed - No data to display  Imaging Review No results found.   EKG Interpretation   Date/Time:  Saturday October 12 2014 17:10:05 EDT Ventricular Rate:  106 PR Interval:  192 QRS Duration: 97 QT Interval:  349 QTC Calculation: 463 R Axis:   45 Text Interpretation:  Sinus tachycardia Left atrial enlargement Artifact  in lead(s) I III aVL V1 V2 No significant change since last tracing  Confirmed by GOLDSTON   MD, SCOTT ([id number]) on 10/12/2014 5:13:34 PM      MDM   Final diagnoses:  Paroxysmal SVT (supraventricular tachycardia)   44 year old female presents with recurrent palpitations. This episode lasted longer than normal and she was feeling slightly lightheaded when it occurred. She called EMS who found her to be conscious alert and oriented. 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 They provided her with 6 mg of IV adenosine prehospital and aborted the episode without difficulty. She remained in sinus rhythm throughout her emergency department stay. She has never been seen by cardiologist for this issue previously. This episode appears to be uncomplicated. She was advised on nicotine and caffeine cessation, she can follow-up routinely with cardiology. She was provided metoprolol 25 once as needed as abortive therapy if prolonged episodes occur and return precautions were discussed for any signs of complication of arrhythmia.    Lyndal Pulley, MD 10/13/14 1173  Pricilla Loveless, MD 10/14/14 [phone]

## 2014-11-27 ENCOUNTER — Encounter: Payer: Self-pay | Admitting: Internal Medicine

## 2014-11-27 ENCOUNTER — Ambulatory Visit: Payer: 59 | Attending: Internal Medicine | Admitting: Internal Medicine

## 2014-11-27 VITALS — BP 166/110 | HR 81 | Temp 98.5°F | Resp 16 | Wt 289.6 lb

## 2014-11-27 DIAGNOSIS — R002 Palpitations: Secondary | ICD-10-CM | POA: Diagnosis not present

## 2014-11-27 DIAGNOSIS — R030 Elevated blood-pressure reading, without diagnosis of hypertension: Secondary | ICD-10-CM

## 2014-11-27 DIAGNOSIS — I471 Supraventricular tachycardia: Secondary | ICD-10-CM | POA: Diagnosis not present

## 2014-11-27 DIAGNOSIS — I10 Essential (primary) hypertension: Secondary | ICD-10-CM

## 2014-11-27 DIAGNOSIS — IMO0001 Reserved for inherently not codable concepts without codable children: Secondary | ICD-10-CM

## 2014-11-27 MED ORDER — METOPROLOL TARTRATE 25 MG PO TABS: 25.0000 mg | ORAL_TABLET | Freq: Two times a day (BID) | ORAL | Status: DC

## 2014-11-27 MED ORDER — CLONIDINE HCL 0.1 MG PO TABS
0.1000 mg | ORAL_TABLET | Freq: Once | ORAL | Status: AC
  Administered 2014-11-27: 0.1 mg via ORAL

## 2014-11-27 NOTE — Patient Instructions (Signed)
Please cess all nicotine and caffeine to prevent SVT

## 2014-11-27 NOTE — Progress Notes (Signed)
Patient ID: Brooke Ballard, female   DOB: 12-16-1970, 44 y.o.   MRN: [id number]  CC: ED follow up   HPI: Brooke Ballard is a 44 y.o. female here today for a follow up visit.  Patient has past medical history of HTN and SVT's. She was seen in the ER on 10/12/14 for symptoms of palpitations. While in ER EKG revealed a venticular rate of 106 with left atrial enlargement. Due to rhythm patient was given 6 mg of Adenosine and stabilized. Patient is unsure if she has been on any medication before she was in the ER. She thinks that she was still taking Diovan around that time but ran out. She reports taking metoprolol after leaving ER but was only given 10 pills. She continues to have palpitations at least once per day. She denies any current palpitations now. She states that she has had palpitations for at least 5 years and has never been evaluated by a Cardiologist.  She is a current smoker 1 ppd. She usually drinks 2 cup of coffee per day and a caffeine beverage in the evening.   Allergies  Allergen Reactions  . Other Shortness Of Breath, Itching and Swelling    ALL STONE FRUITS   . Contrast Media [Iodinated Diagnostic Agents] Itching and Swelling  . Morphine And Related Itching and Swelling   Past Medical History  Diagnosis Date  . Hypertension   . Borderline diabetes    Current Outpatient Prescriptions on File Prior to Visit  Medication Sig Dispense Refill  . amLODipine (NORVASC) 10 MG tablet Take 10 mg by mouth daily.    Marland Kitchen doxycycline (VIBRAMYCIN) 100 MG capsule Take 1 capsule (100 mg total) by mouth 2 (two) times daily. 20 capsule 0  . metoprolol (LOPRESSOR) 25 MG tablet Take 1 tablet (25 mg total) by mouth once as needed (palpitations). 10 tablet 0  . Multiple Vitamin (MULTIVITAMIN) LIQD Take 15 mLs by mouth daily.    . naproxen sodium (ANAPROX) 220 MG tablet Take 220 mg by mouth 2 (two) times daily as needed (FOR PAIN).    . valsartan (DIOVAN) 160 MG tablet Take 1 tablet (160 mg total) by mouth  daily. 30 tablet 5   Current Facility-Administered Medications on File Prior to Visit  Medication Dose Route Frequency Provider Last Rate Last Dose  . triamcinolone acetonide (KENALOG) 10 MG/ML injection 10 mg  10 mg Other Once Alvan Dame, DPM       Family History  Problem Relation Age of Onset  . Hypertension Mother   . Hypertension Father    History   Social History  . Marital Status: Divorced    Spouse Name: N/A  . Number of Children: N/A  . Years of Education: N/A   Occupational History  . Not on file.   Social History Main Topics  . Smoking status: Current Every Day Smoker -- 1.00 packs/day  . Smokeless tobacco: Not on file     Comment: Smoking 1 ppd  . Alcohol Use: 0.0 oz/week    0 Standard drinks or equivalent per week     Comment: social  . Drug Use: No  . Sexual Activity: Not on file   Other Topics Concern  . Not on file   Social History Narrative    Review of Systems  Eyes: Negative for blurred vision.  Respiratory: Positive for shortness of breath (when having palpitations). Negative for cough and wheezing.   Cardiovascular: Positive for palpitations. Negative for chest pain and leg swelling.  Neurological:  Positive for dizziness. Negative for headaches.  All other systems reviewed and are negative.   Objective:   Filed Vitals:   11/27/14 1110  BP: 166/110  Pulse:   Temp:   Resp:     Physical Exam  Constitutional: She is oriented to person, place, and time. No distress.  Cardiovascular: Normal rate, regular rhythm and normal heart sounds.   No murmur heard. Pulmonary/Chest: Effort normal and breath sounds normal.  Musculoskeletal: She exhibits no edema.  Neurological: She is oriented to person, place, and time.  Skin: Skin is warm and dry. She is not diaphoretic.     Lab Results  Component Value Date   WBC 9.1 07/18/2014   HGB 14.6 07/18/2014   HCT 43.0 07/18/2014   MCV 89.4 07/18/2014   PLT 264 07/18/2014   Lab Results   Component Value Date   CREATININE 0.76 07/18/2014   BUN 13 07/18/2014   NA 139 07/18/2014   K 5.1 07/18/2014   CL 105 07/18/2014   CO2 24 07/18/2014    Lab Results  Component Value Date   HGBA1C 6.1* 07/18/2014   Lipid Panel     Component Value Date/Time   CHOL 178 07/18/2014 1025   TRIG 108 07/18/2014 1025   HDL 32* 07/18/2014 1025   CHOLHDL 5.6 07/18/2014 1025   VLDL 22 07/18/2014 1025   LDLCALC 124* 07/18/2014 1025       Assessment and plan:   Kaetlin was seen today for follow-up.  Diagnoses and all orders for this visit:  Essential hypertension Orders: -    Begin metoprolol tartrate (LOPRESSOR) 25 MG tablet; Take 1 tablet (25 mg total) by mouth 2 (two) times daily. Patient has not been taking Diovan, so will discontinue it for now. I will place patient on Metoprolol 25 mg BID to help with palpitations and BP. I will have her come back in 2 weeks to check her pressures.   Elevated BP Orders: -     cloNIDine (CATAPRES) tablet 0.1 mg; Take 1 tablet (0.1 mg total) by mouth once in office Patient asymptomatic, was able to give clonidine in office and stabilize patients pressure. I have refilled medications   Palpitations Orders: See above. No palpitations today. Cardiology referral sent today.  I have explained to patient that she needs to quit smoking and drinking caffeine because this could exacerbate palpitations.  SVT (supraventricular tachycardia) Orders: Currently in NSR. Will refer out for further evaluation  Tobacco use  Smoking cessation discussed for 3 minutes, patient is not willing to quit at this time. Will continue to assess on each visit. Discussed increased risk for diseases such as cancer, heart disease, and stroke.   Return in about 2 weeks (around 12/11/2014) for Nurse Visit-BP check and 3 mo PCP .    25 minutes spent with patient reviewing past history/ekg/ED notes and stressing medication compliance to prevent risk of fatal cardiac  arrhythmia.       Ambrose Finland, NP-C [facility] [phone] 11/27/2014, 11:15 AM

## 2014-11-27 NOTE — Progress Notes (Signed)
Here for hospital follow up States she went to ER for SVT Patient is out of meds  Needs refill

## 2014-12-02 ENCOUNTER — Emergency Department (HOSPITAL_COMMUNITY)
Admission: EM | Admit: 2014-12-02 | Discharge: 2014-12-02 | Disposition: A | Discharge status: Home / Self Care | Payer: 59 | Source: Home / Self Care | Attending: Family Medicine | Admitting: Family Medicine

## 2014-12-02 ENCOUNTER — Encounter (HOSPITAL_COMMUNITY): Payer: Self-pay | Admitting: Emergency Medicine

## 2014-12-02 DIAGNOSIS — I160 Hypertensive urgency: Secondary | ICD-10-CM

## 2014-12-02 DIAGNOSIS — I10 Essential (primary) hypertension: Secondary | ICD-10-CM

## 2014-12-02 DIAGNOSIS — Z720 Tobacco use: Secondary | ICD-10-CM

## 2014-12-02 DIAGNOSIS — G44019 Episodic cluster headache, not intractable: Secondary | ICD-10-CM

## 2014-12-02 MED ORDER — DEXAMETHASONE 2 MG PO TABS
ORAL_TABLET | ORAL | Status: AC
Start: 2014-12-02 — End: 2014-12-02
  Filled 2014-12-02: qty 1

## 2014-12-02 MED ORDER — DEXAMETHASONE 4 MG PO TABS
10.0000 mg | ORAL_TABLET | Freq: Once | ORAL | Status: AC
  Administered 2014-12-02: 10 mg via ORAL

## 2014-12-02 MED ORDER — CLONIDINE HCL 0.1 MG PO TABS
ORAL_TABLET | ORAL | Status: AC
  Filled 2014-12-02: qty 1

## 2014-12-02 MED ORDER — CLONIDINE HCL 0.1 MG PO TABS
ORAL_TABLET | ORAL | Status: AC
  Filled 2014-12-02: qty 2

## 2014-12-02 MED ORDER — CLONIDINE HCL 0.1 MG PO TABS
0.1000 mg | ORAL_TABLET | Freq: Once | ORAL | Status: AC
  Administered 2014-12-02: 0.1 mg via ORAL

## 2014-12-02 MED ORDER — SUMATRIPTAN SUCCINATE 6 MG/0.5ML ~~LOC~~ SOLN
6.0000 mg | Freq: Once | SUBCUTANEOUS | Status: AC
  Administered 2014-12-02: 6 mg via SUBCUTANEOUS

## 2014-12-02 MED ORDER — DEXAMETHASONE 4 MG PO TABS
ORAL_TABLET | ORAL | Status: AC
  Filled 2014-12-02: qty 2

## 2014-12-02 MED ORDER — SUMATRIPTAN SUCCINATE 6 MG/0.5ML ~~LOC~~ SOLN
SUBCUTANEOUS | Status: AC
  Filled 2014-12-02: qty 0.5

## 2014-12-02 MED ORDER — CLONIDINE HCL 0.1 MG PO TABS
0.2000 mg | ORAL_TABLET | Freq: Once | ORAL | Status: AC
  Administered 2014-12-02: 0.2 mg via ORAL

## 2014-12-02 NOTE — ED Notes (Signed)
Dr. Konrad Dolores notified of Pt's BP.  Ok to d/c home.

## 2014-12-02 NOTE — ED Notes (Signed)
Pt has a history of right-sided cluster headaches that was treated with medication and went into remission about 8-9 years ago.  For the last week, she has been suffering from the same issue, but this time on the left side.

## 2014-12-02 NOTE — ED Provider Notes (Addendum)
CSN: [id number]     Arrival date & time 12/02/14  1622 History   First MD Initiated Contact with Patient 12/02/14 1738     Chief Complaint  Patient presents with  . Headache   (Consider location/radiation/quality/duration/timing/severity/associated sxs/prior Treatment) HPI  HA off and on for past week. Starts in L forehead then radiates to back of head. Comes on at random. Episodes typiclaly last 30 sec to 2 min. No nausea, vomiting, photophobia, dizziness, syncope, phonophobia. Pain is so severe that she has to stop what she is doing until the pain subsides.   Smokes 1/2ppd.   Recently tried to decreased her smoking. Cut back from 1ppd to 1/2ppd.  H/o cluster HA. Dx in 2007 but then went away spontaneously after several months of having them.     Past Medical History  Diagnosis Date  . Hypertension   . Borderline diabetes    Past Surgical History  Procedure Laterality Date  . Abdominal hysterectomy    . Back surgery     Family History  Problem Relation Age of Onset  . Hypertension Mother   . Hypertension Father    History  Substance Use Topics  . Smoking status: Current Every Day Smoker -- 0.50 packs/day  . Smokeless tobacco: Not on file     Comment: Smoking 1 ppd  . Alcohol Use: 0.0 oz/week    0 Standard drinks or equivalent per week     Comment: social   OB History    No data available     Review of Systems Per HPI with all other pertinent systems negative.   Allergies  Other; Contrast media; and Morphine and related  Home Medications   Prior to Admission medications   Medication Sig Start Date End Date Taking? Authorizing Provider  metoprolol tartrate (LOPRESSOR) 25 MG tablet Take 1 tablet (25 mg total) by mouth 2 (two) times daily. 11/27/14  Yes Ambrose Finland, NP  doxycycline (VIBRAMYCIN) 100 MG capsule Take 1 capsule (100 mg total) by mouth 2 (two) times daily. 08/10/14   Linna Hoff, MD  Multiple Vitamin (MULTIVITAMIN) LIQD Take 15 mLs by mouth daily.     Historical Provider, MD  naproxen sodium (ANAPROX) 220 MG tablet Take 220 mg by mouth 2 (two) times daily as needed (FOR PAIN).    Historical Provider, MD   BP 158/110 mmHg  Pulse 81  Temp(Src) 98.4 F (36.9 C) (Oral)  Resp 16  SpO2 100%  LMP 07/09/2012 Physical Exam Physical Exam  Constitutional: oriented to person, place, and time. appears well-developed and well-nourished. No distress.  HENT:  Head: Normocephalic and atraumatic.  Eyes: EOMI. PERRL.  Neck: Normal range of motion.  Cardiovascular: RRR, no m/r/g, 2+ distal pulses,  Pulmonary/Chest: Effort normal and breath sounds normal. No respiratory distress.  Abdominal: Soft. Bowel sounds are normal. NonTTP, no distension.  Musculoskeletal: Normal range of motion. Non ttp, no effusion.  Neurological: alert and oriented to person, place, and time.  Skin: Skin is warm. No rash noted. non diaphoretic.  Psychiatric: normal mood and affect. behavior is normal. Judgment and thought content normal.   ED Course  Procedures (including critical care time) Labs Review Labs Reviewed - No data to display  Imaging Review No results found.   MDM   1. Episodic cluster headache, not intractable   2. Hypertensive urgency   3. Tobacco use    Headache is likely multifactorial including underlying cluster headache condition, nicotine withdrawal, and hypertensive urgency. Of note patient with multiple  other readings of high blood pressure including diastolic pressures greater as high as 110. Clonidine 0.1 mg by mouth given in clinic without improvement. Additional 0.2mg  clonidine given w/ improvement Patient headache treated with Imitrex 6 kg subcutaneous and Decadron 10 mg by mouth with improvement. Patient to blunt the effects of nicotine withdrawal from her smoking cessation by using nicotine gum or other nicotine replacement. This is explained at length with patient who understands how to properly take nicotine replacement.    Ozella Rocks, MD 12/02/14 1756  Ozella Rocks, MD 12/02/14 [phone]

## 2014-12-02 NOTE — Discharge Instructions (Signed)
Your headaches are likely from accommodation of nicotine withdrawal, high blood pressure, and an underlying cluster headache condition. You were treated today with a dose of fast acting blood pressure medicine, anti-headache medicines including Imitrex and Decadron. Please follow-up with your primary care physician if you continue headaches or go to the emergency room if they get worse. Please try and blunt the nicotine withdrawal from your smoking cessation by using nicotine gum. Remember one cigarette has 1 mg of nicotine.

## 2014-12-06 ENCOUNTER — Emergency Department (HOSPITAL_COMMUNITY)
Admission: EM | Admit: 2014-12-06 | Discharge: 2014-12-06 | Disposition: A | Discharge status: Home / Self Care | Payer: 59 | Attending: Emergency Medicine | Admitting: Emergency Medicine

## 2014-12-06 ENCOUNTER — Encounter (HOSPITAL_COMMUNITY): Payer: Self-pay | Admitting: *Deleted

## 2014-12-06 DIAGNOSIS — I10 Essential (primary) hypertension: Secondary | ICD-10-CM | POA: Insufficient documentation

## 2014-12-06 DIAGNOSIS — R51 Headache: Secondary | ICD-10-CM | POA: Insufficient documentation

## 2014-12-06 DIAGNOSIS — Z79899 Other long term (current) drug therapy: Secondary | ICD-10-CM | POA: Insufficient documentation

## 2014-12-06 DIAGNOSIS — Z720 Tobacco use: Secondary | ICD-10-CM | POA: Diagnosis not present

## 2014-12-06 DIAGNOSIS — R519 Headache, unspecified: Secondary | ICD-10-CM

## 2014-12-06 MED ORDER — BUTALBITAL-APAP-CAFFEINE 50-325-40 MG PO TABS: 1.0000 | ORAL_TABLET | Freq: Four times a day (QID) | ORAL | Status: AC | PRN

## 2014-12-06 NOTE — ED Notes (Addendum)
Pt complains of headaches since last Wednesday. Pt states she has a history of cluster headaches. Pt states she is also concerned about her blood pressure, which she states was 204/118 today.

## 2014-12-06 NOTE — Discharge Instructions (Signed)

## 2014-12-06 NOTE — ED Provider Notes (Signed)
CSN: [id number]     Arrival date & time 12/06/14  2154 History   First MD Initiated Contact with Patient 12/06/14 2227     Chief Complaint  Patient presents with  . Headache  . Hypertension     (Consider location/radiation/quality/duration/timing/severity/associated sxs/prior Treatment) HPI   44 year old female with history of cluster headache diagnosed in 2007 and history of hypertension presenting for evaluation of headache. Patient mentioned for the past week she has had recurrent left-sided headache. She describes headache as a sharp sensation which started around her left eye and radiates to the back for headache. Headache usually lasting approximate 1-3 minutes, and resolved without any specific treatment. She would have 5-10 episodes a day. She has been taking Motrin with some relief. Today while at her mother's house, she check her blood pressure and noted to be 204/118 which concerns her and prompted her to come to the ER for further evaluation. Patient mentioned that she has been seen by her new primary care provider for her high blood pressure approximate month ago. She was placed on metoprolol which she has been taken but felt that it has not adequately controlled blood pressure. She mentioned that she was on 2 blood pressure medications in the past but her primary care provider does not want to initiate a second agent at this time. Otherwise patient denies having fever, chills, double vision, scintillating scotoma, light and sound sensitivity, facial numbness, difficulty thinking, neck stiffness, or rash. She denies any chest pain, shortness of breath, lightheadedness and dizziness. Prior to arrival she did endorse mild headache but now has since resolved. She has no other complaint.  Past Medical History  Diagnosis Date  . Hypertension   . Borderline diabetes    Past Surgical History  Procedure Laterality Date  . Abdominal hysterectomy    . Back surgery     Family History   Problem Relation Age of Onset  . Hypertension Mother   . Hypertension Father    Social History  Substance Use Topics  . Smoking status: Current Every Day Smoker -- 0.50 packs/day  . Smokeless tobacco: None     Comment: Smoking 1 ppd  . Alcohol Use: 0.0 oz/week    0 Standard drinks or equivalent per week     Comment: social   OB History    No data available     Review of Systems  All other systems reviewed and are negative.     Allergies  Other; Contrast media; and Morphine and related  Home Medications   Prior to Admission medications   Medication Sig Start Date End Date Taking? Authorizing Provider  doxycycline (VIBRAMYCIN) 100 MG capsule Take 1 capsule (100 mg total) by mouth 2 (two) times daily. 08/10/14   Linna Hoff, MD  metoprolol tartrate (LOPRESSOR) 25 MG tablet Take 1 tablet (25 mg total) by mouth 2 (two) times daily. 11/27/14   Ambrose Finland, NP  Multiple Vitamin (MULTIVITAMIN) LIQD Take 15 mLs by mouth daily.    Historical Provider, MD  naproxen sodium (ANAPROX) 220 MG tablet Take 220 mg by mouth 2 (two) times daily as needed (FOR PAIN).    Historical Provider, MD   BP 163/106 mmHg  Pulse 75  Temp(Src) 98 F (36.7 C) (Oral)  Resp 18  SpO2 97%  LMP 07/09/2012 Physical Exam  Constitutional: She is oriented to person, place, and time. She appears well-developed and well-nourished. No distress.  HENT:  Head: Atraumatic.  Right Ear: External ear normal.  Left  Ear: External ear normal.  Nose: Nose normal.  Mouth/Throat: Oropharynx is clear and moist.  No temporal bruits  Eyes: Conjunctivae and EOM are normal. Pupils are equal, round, and reactive to light.  Neck: Neck supple.  No nuchal rigidity.  Cardiovascular: Normal rate, regular rhythm and intact distal pulses.   Pulmonary/Chest: Effort normal and breath sounds normal.  Abdominal: Soft. There is no tenderness.  Neurological: She is alert and oriented to person, place, and time.  Neurologic  exam:  Speech clear, pupils equal round reactive to light, extraocular movements intact  Normal peripheral visual fields Cranial nerves III through XII normal including no facial droop Follows commands, moves all extremities x4, normal strength to bilateral upper and lower extremities at all major muscle groups including grip Sensation normal to light touch  Coordination intact, no limb ataxia, finger-nose-finger normal Rapid alternating movements normal No pronator drift Gait normal   Skin: No rash noted.  Psychiatric: She has a normal mood and affect.  Nursing note and vitals reviewed.   ED Course  Procedures (including critical care time)  10:46 PM History of recurrent headache. Currently headache free. She is mildly hypertensive with a blood pressure of 160/106. She exhibits no focal neuro deficit concerning for stroke. No fever or nuchal rigidity concerning for meningitis, and no acute onset thunderclap headache concerning for subarachnoid hemorrhage. Recommend patient to follow-up with her PCP for further management of her high blood pressure. I did offer migraine cocktail however patient declined at this time since she has no symptoms. I do not think that imaging is indicated at this time. Return precautions discussed.  Labs Review Labs Reviewed - No data to display  Imaging Review No results found. I, Demetruis Depaul, personally reviewed and evaluated these images and lab results as part of my medical decision-making.   EKG Interpretation None      MDM   Final diagnoses:  Recurrent headache  Essential hypertension    BP 163/106 mmHg  Pulse 75  Temp(Src) 98 F (36.7 C) (Oral)  Resp 18  SpO2 97%  LMP 07/09/2012     Fayrene Helper, PA-C 12/06/14 2256  Richardean Canal, MD 12/06/14 2314

## 2014-12-09 ENCOUNTER — Ambulatory Visit: Payer: 59 | Attending: Internal Medicine | Admitting: Internal Medicine

## 2014-12-09 ENCOUNTER — Encounter: Payer: Self-pay | Admitting: Internal Medicine

## 2014-12-09 VITALS — BP 141/92 | HR 71 | Temp 99.1°F | Resp 18 | Ht 68.5 in | Wt 286.0 lb

## 2014-12-09 DIAGNOSIS — R51 Headache: Secondary | ICD-10-CM | POA: Diagnosis not present

## 2014-12-09 DIAGNOSIS — Z79899 Other long term (current) drug therapy: Secondary | ICD-10-CM | POA: Insufficient documentation

## 2014-12-09 DIAGNOSIS — I10 Essential (primary) hypertension: Secondary | ICD-10-CM | POA: Insufficient documentation

## 2014-12-09 DIAGNOSIS — G44009 Cluster headache syndrome, unspecified, not intractable: Secondary | ICD-10-CM | POA: Diagnosis not present

## 2014-12-09 DIAGNOSIS — F17210 Nicotine dependence, cigarettes, uncomplicated: Secondary | ICD-10-CM | POA: Insufficient documentation

## 2014-12-09 MED ORDER — VERAPAMIL HCL 80 MG PO TABS: 80.0000 mg | ORAL_TABLET | Freq: Two times a day (BID) | ORAL | Status: DC

## 2014-12-09 NOTE — Progress Notes (Signed)
Pt's here for re-occuring HA's clusters. Pt was seen at the ER and Urgent care x1wk ago. Pt describe pain as a contraction, with variation time during the day. Pt reports of onset 11/27/2014. Pain level 10+. Pt reports taken meds this morning.

## 2014-12-09 NOTE — Progress Notes (Signed)
Patient ID: Brooke Ballard, female   DOB: 29-Mar-1971, 44 y.o.   MRN: [id number]  CC: headaches  HPI: Brooke Ballard is a 44 year old female with history of cluster headache diagnosed in 2007 and history of hypertension presenting for evaluation of headache. Patient mentioned for the past week she has had recurrent left-sided headache. She describes headache as a sharp sensation/"contraction" which started around her left eye and radiates to the back for headache. Headache usually lasting approximately 1-3 minutes, and resolves without any specific treatment. She states that she does use Motrin as needed with little relief. She reports that in the past she was on medications for headaches but does not remember the name of the medication. She denies tingling, blurred vision, photophobia, abdominal pain, nausea, vomiting.   Allergies  Allergen Reactions  . Other Shortness Of Breath, Itching and Swelling    ALL STONE FRUITS   . Contrast Media [Iodinated Diagnostic Agents] Itching and Swelling  . Morphine And Related Itching and Swelling   Past Medical History  Diagnosis Date  . Hypertension   . Borderline diabetes    Current Outpatient Prescriptions on File Prior to Visit  Medication Sig Dispense Refill  . aspirin-acetaminophen-caffeine (EXCEDRIN MIGRAINE) 250-250-65 MG per tablet Take 2 tablets by mouth every 6 (six) hours as needed for headache.    . metoprolol tartrate (LOPRESSOR) 25 MG tablet Take 1 tablet (25 mg total) by mouth 2 (two) times daily. 60 tablet 2  . Multiple Vitamin (MULTIVITAMIN) LIQD Take 15 mLs by mouth daily.    . butalbital-acetaminophen-caffeine (FIORICET) 50-325-40 MG per tablet Take 1 tablet by mouth every 6 (six) hours as needed for headache or migraine. (Patient not taking: Reported on 12/09/2014) 20 tablet 0  . doxycycline (VIBRAMYCIN) 100 MG capsule Take 1 capsule (100 mg total) by mouth 2 (two) times daily. (Patient not taking: Reported on 12/06/2014) 20 capsule 0  .  naproxen sodium (ANAPROX) 220 MG tablet Take 220 mg by mouth 2 (two) times daily as needed (FOR PAIN).     Current Facility-Administered Medications on File Prior to Visit  Medication Dose Route Frequency Provider Last Rate Last Dose  . triamcinolone acetonide (KENALOG) 10 MG/ML injection 10 mg  10 mg Other Once Alvan Dame, DPM       Family History  Problem Relation Age of Onset  . Hypertension Mother   . Hypertension Father    Social History   Social History  . Marital Status: Divorced    Spouse Name: N/A  . Number of Children: N/A  . Years of Education: N/A   Occupational History  . Not on file.   Social History Main Topics  . Smoking status: Current Every Day Smoker -- 0.50 packs/day  . Smokeless tobacco: Not on file     Comment: Smoking 1 ppd  . Alcohol Use: 0.0 oz/week    0 Standard drinks or equivalent per week     Comment: social  . Drug Use: No  . Sexual Activity: Not on file   Other Topics Concern  . Not on file   Social History Narrative    Review of Systems  Eyes: Negative for blurred vision.  Cardiovascular: Negative.   Gastrointestinal: Negative.  Negative for nausea, vomiting and abdominal pain.  Musculoskeletal: Negative.   Neurological: Positive for headaches. Negative for dizziness, tingling, sensory change, speech change and focal weakness.  All other systems reviewed and are negative.   Objective:   Filed Vitals:   12/09/14 1404  BP: 141/92  Pulse: 71  Temp: 99.1 F (37.3 C)  Resp: 18    Physical Exam  Constitutional: She is oriented to person, place, and time.  Cardiovascular: Normal rate, regular rhythm and normal heart sounds.   Pulmonary/Chest: Effort normal and breath sounds normal.  Musculoskeletal: She exhibits no edema.  Neurological: She is alert and oriented to person, place, and time. No cranial nerve deficit. Coordination normal.     Lab Results  Component Value Date   WBC 9.1 07/18/2014   HGB 14.6 07/18/2014    HCT 43.0 07/18/2014   MCV 89.4 07/18/2014   PLT 264 07/18/2014   Lab Results  Component Value Date   CREATININE 0.76 07/18/2014   BUN 13 07/18/2014   NA 139 07/18/2014   K 5.1 07/18/2014   CL 105 07/18/2014   CO2 24 07/18/2014    Lab Results  Component Value Date   HGBA1C 6.1* 07/18/2014   Lipid Panel     Component Value Date/Time   CHOL 178 07/18/2014 1025   TRIG 108 07/18/2014 1025   HDL 32* 07/18/2014 1025   CHOLHDL 5.6 07/18/2014 1025   VLDL 22 07/18/2014 1025   LDLCALC 124* 07/18/2014 1025       Assessment and plan:   Brooke Ballard was seen today for headache.  Diagnoses and all orders for this visit:  Cluster headache, not intractable, unspecified chronicity pattern -     verapamil (CALAN) 80 MG tablet; Take 1 tablet (80 mg total) by mouth 2 (two) times daily. Patient already has script for Fioricet, she may take that for abortive therapy. Will add calcium channel blocker to help with BP and as a preventative medication for headaches.  She will keep a headache diary to see if she notices any improvement   Essential hypertension See above     She has BP check with nurse scheduled for next week. We can follow up in 3 mo for HTN or as needed         Ambrose Finland, NP-C [facility] [phone] 12/09/2014, 2:20 PM

## 2014-12-09 NOTE — Patient Instructions (Signed)
Cluster Headache Cluster headaches are recognized by their pattern of deep, intense head pain. They normally occur on one side of your head, but they may "switch sides" in subsequent episodes. Typically, cluster headaches:   Are severe in nature.   Occur repeatedly over weeks to months and are followed by periods of no headaches.   Can last from 15 minutes to 3 hours.   Occur at the same time each day, often at night.   Occur several times a day. CAUSES The exact cause of cluster headaches is not known. Alcohol use may be associated with cluster headaches. SIGNS AND SYMPTOMS   Severe pain that begins in or around your eye or temple.   One-sided head pain.   Feeling sick to your stomach (nauseous).   Sensitivity to light.   Runny nose.   Eye redness, tearing, and nasal stuffiness on the side of your head where you are experiencing pain.   Sweaty, pale skin of the face.   Droopy or swollen eyelid.   Restlessness. DIAGNOSIS  Cluster headaches are diagnosed based on symptoms and a physical exam. Your health care provider may order a CT scan or an MRI of your head or lab tests to see if your headaches are caused by other medical conditions.  TREATMENT   Medicines for pain relief and to prevent recurrent attacks. Some people may need a combination of medicines.  Oxygen for pain relief.   Biofeedback programs to help reduce headache pain.  It may be helpful to keep a headache diary. This may help you find a trend for what is triggering your headaches. Your health care provider can develop a treatment plan.  HOME CARE INSTRUCTIONS  During cluster periods:   Follow a regular sleep schedule. Do not vary the amount and time that you sleep from day to day. It is important to stay on the same schedule during a cluster period to help prevent headaches.   Avoid alcohol.   Stop smoking if you smoke.  SEEK MEDICAL CARE IF:  You have any changes from your previous  cluster headaches either in intensity or frequency.   You are not getting relief from medicines you are taking.  SEEK IMMEDIATE MEDICAL CARE IF:   You faint.   You have weakness or numbness, especially on one side of your body or face.   You have double vision.   You have nausea or vomiting that is not relieved within several hours.   You cannot keep your balance or have difficulty talking or walking.   You have neck pain or stiffness.   You have a fever. MAKE SURE YOU:  Understand these instructions.   Will watch your condition.   Will get help right away if you are not doing well or get worse. Document Released: 04/12/2005 Document Revised: 01/31/2013 Document Reviewed: 11/02/2012 ExitCare Patient Information 2015 [employer]. This information is not intended to replace advice given to you by your health care provider. Make sure you discuss any questions you have with your health care provider.  

## 2014-12-10 DIAGNOSIS — G44009 Cluster headache syndrome, unspecified, not intractable: Secondary | ICD-10-CM | POA: Insufficient documentation

## 2014-12-25 ENCOUNTER — Ambulatory Visit: Payer: 59 | Admitting: Internal Medicine

## 2015-01-14 ENCOUNTER — Ambulatory Visit: Payer: 59 | Attending: Internal Medicine | Admitting: Pharmacist

## 2015-01-14 VITALS — BP 146/100 | HR 78

## 2015-01-14 DIAGNOSIS — Z23 Encounter for immunization: Secondary | ICD-10-CM | POA: Diagnosis not present

## 2015-01-14 DIAGNOSIS — I10 Essential (primary) hypertension: Secondary | ICD-10-CM | POA: Insufficient documentation

## 2015-01-14 MED ORDER — TETANUS-DIPHTH-ACELL PERTUSSIS 5-2.5-18.5 LF-MCG/0.5 IM SUSP
0.5000 mL | Freq: Once | INTRAMUSCULAR | Status: AC
Start: 2015-01-14 — End: 2015-01-14
  Administered 2015-01-14: 0.5 mL via INTRAMUSCULAR

## 2015-01-14 NOTE — Patient Instructions (Signed)

## 2015-01-14 NOTE — Progress Notes (Signed)
Patient presented for tetanus shot.  Information reviewed and written information was provided.  Checked patient's blood pressure - she has been out of her blood pressure medication for the last few days. Encouraged her to pick it up as blood pressure was still elevated.   Juanita Craver, PharmD, BCPS, CPP Clinical Pharmacist Practitioner  [facility] [phone]

## 2015-01-14 NOTE — Addendum Note (Signed)
Addended by: Juanita Craver A on: 01/14/2015 04:39 PM   Modules accepted: Level of Service

## 2015-01-22 ENCOUNTER — Encounter (HOSPITAL_COMMUNITY): Payer: Self-pay | Admitting: Emergency Medicine

## 2015-01-22 ENCOUNTER — Emergency Department (HOSPITAL_COMMUNITY): Payer: 59

## 2015-01-22 ENCOUNTER — Emergency Department (HOSPITAL_COMMUNITY)
Admission: EM | Admit: 2015-01-22 | Discharge: 2015-01-22 | Disposition: A | Discharge status: Home / Self Care | Payer: 59 | Attending: Emergency Medicine | Admitting: Emergency Medicine

## 2015-01-22 ENCOUNTER — Ambulatory Visit: Payer: 59 | Admitting: Internal Medicine

## 2015-01-22 DIAGNOSIS — Y9301 Activity, walking, marching and hiking: Secondary | ICD-10-CM | POA: Insufficient documentation

## 2015-01-22 DIAGNOSIS — Y998 Other external cause status: Secondary | ICD-10-CM | POA: Insufficient documentation

## 2015-01-22 DIAGNOSIS — S99912A Unspecified injury of left ankle, initial encounter: Secondary | ICD-10-CM | POA: Insufficient documentation

## 2015-01-22 DIAGNOSIS — Y92008 Other place in unspecified non-institutional (private) residence as the place of occurrence of the external cause: Secondary | ICD-10-CM | POA: Diagnosis not present

## 2015-01-22 DIAGNOSIS — W108XXA Fall (on) (from) other stairs and steps, initial encounter: Secondary | ICD-10-CM | POA: Diagnosis not present

## 2015-01-22 DIAGNOSIS — I10 Essential (primary) hypertension: Secondary | ICD-10-CM | POA: Insufficient documentation

## 2015-01-22 DIAGNOSIS — Z79899 Other long term (current) drug therapy: Secondary | ICD-10-CM | POA: Diagnosis not present

## 2015-01-22 DIAGNOSIS — Z720 Tobacco use: Secondary | ICD-10-CM | POA: Insufficient documentation

## 2015-01-22 MED ORDER — IBUPROFEN 800 MG PO TABS
800.0000 mg | ORAL_TABLET | Freq: Once | ORAL | Status: AC
  Administered 2015-01-22: 800 mg via ORAL
  Filled 2015-01-22: qty 1

## 2015-01-22 NOTE — ED Notes (Signed)
Per pt, states she missed a step off a porch this am-injured left ankle

## 2015-01-22 NOTE — Discharge Instructions (Signed)
Please use the ace wrap, ice, rest, elevation and ibuprofen for pain relief.  Please follow up with orthopedics. Please return to the Emergency Department if symptoms worsen.

## 2015-01-22 NOTE — ED Provider Notes (Signed)
CSN: [id number]     Arrival date & time 01/22/15  1201 History  This chart was scribed for non-physician practitioner, Barrett Henle, PA-C working with Elwin Mocha, MD by Placido Sou, ED scribe. This patient was seen in room WTR9/WTR9 and the patient's care was started at 1:34 PM.   Chief Complaint  Patient presents with  . Ankle Injury   The history is provided by the patient. No language interpreter was used.    HPI Comments: Brooke Ballard is a 44 y.o. female, with a hx of HTN and obesity, who presents to the Emergency Department complaining of a fall that occurred this morning. Pt notes that she missed a step when walking down her porch and fell forwards landing in the grass. Pt notes associated, constant, 5/10, non-radiating, left ankle pain which worsens when bearing weight or ambulating. Endorses swelling to lateral ankle. She notes a hx of fracture to the medial aspect of the affected ankle about 20 years ago. Pt denies taking any medications for pain management PTA. Pt confirms her listed allergies. She denies any LOC or acute weakness, numbness, tingling.   Past Medical History  Diagnosis Date  . Hypertension   . Borderline diabetes    Past Surgical History  Procedure Laterality Date  . Abdominal hysterectomy    . Back surgery     Family History  Problem Relation Age of Onset  . Hypertension Mother   . Hypertension Father    Social History  Substance Use Topics  . Smoking status: Current Every Day Smoker -- 0.50 packs/day  . Smokeless tobacco: None     Comment: Smoking 1 ppd  . Alcohol Use: 0.0 oz/week    0 Standard drinks or equivalent per week     Comment: social   OB History    No data available     Review of Systems  Musculoskeletal: Positive for arthralgias.  Skin: Negative for wound.  Neurological: Negative for syncope and weakness.   Allergies  Other; Contrast media; and Morphine and related  Home Medications   Prior to Admission  medications   Medication Sig Start Date End Date Taking? Authorizing Provider  aspirin-acetaminophen-caffeine (EXCEDRIN MIGRAINE) [phone] MG per tablet Take 2 tablets by mouth every 6 (six) hours as needed for headache.    Historical Provider, MD  butalbital-acetaminophen-caffeine (FIORICET) 50-325-40 MG per tablet Take 1 tablet by mouth every 6 (six) hours as needed for headache or migraine. Patient not taking: Reported on 01/14/2015 12/06/14 12/06/15  Fayrene Helper, PA-C  doxycycline (VIBRAMYCIN) 100 MG capsule Take 1 capsule (100 mg total) by mouth 2 (two) times daily. Patient not taking: Reported on 01/14/2015 08/10/14   Linna Hoff, MD  metoprolol tartrate (LOPRESSOR) 25 MG tablet Take 1 tablet (25 mg total) by mouth 2 (two) times daily. 11/27/14   Ambrose Finland, NP  Multiple Vitamin (MULTIVITAMIN) LIQD Take 15 mLs by mouth daily.    Historical Provider, MD  naproxen sodium (ANAPROX) 220 MG tablet Take 220 mg by mouth 2 (two) times daily as needed (FOR PAIN).    Historical Provider, MD  verapamil (CALAN) 80 MG tablet Take 1 tablet (80 mg total) by mouth 2 (two) times daily. 12/09/14   Ambrose Finland, NP   BP 145/109 mmHg  Pulse 82  Temp(Src) 98.5 F (36.9 C) (Oral)  Resp 18  SpO2 100%  LMP 07/09/2012 Physical Exam  Constitutional: She is oriented to person, place, and time. She appears well-developed and well-nourished. No distress.  HENT:  Head: Normocephalic and atraumatic.  Mouth/Throat: No oropharyngeal exudate.  Neck: Normal range of motion. No tracheal deviation present.  Cardiovascular: Normal rate.   Pulmonary/Chest: Effort normal. No respiratory distress.  Abdominal: Soft. There is no tenderness.  Musculoskeletal:       Left ankle: She exhibits decreased range of motion and swelling. She exhibits no ecchymosis, no deformity, no laceration and normal pulse. Tenderness. Lateral malleolus tenderness found.  DROM of left ankle due to pain; FROM of left left knee; TTP at lateral  malleolus with mild swelling noted; 2+ DP pulses; sensation intact; FROM of left toes; pt unable to stand on left foot due to pain.  Neurological: She is alert and oriented to person, place, and time.  Skin: Skin is warm and dry. She is not diaphoretic.  Psychiatric: She has a normal mood and affect. Her behavior is normal.  Nursing note and vitals reviewed.  ED Course  Procedures  DIAGNOSTIC STUDIES: Oxygen Saturation is 100% on RA, normal by my interpretation.    COORDINATION OF CARE: 1:43 PM Discussed treatment plan with pt at bedside and pt agreed to plan.  Labs Review Labs Reviewed - No data to display  Imaging Review Dg Ankle Complete Left  01/22/2015   CLINICAL DATA:  Larey Seat downstairs today.  Left ankle pain.  EXAM: LEFT ANKLE COMPLETE - 3+ VIEW  COMPARISON:  Left foot radiographs 11/28/2013  FINDINGS: The ankle mortise is maintained. No acute ankle fracture. Small bony densities near the medial and lateral malleolar I are likely remote avulsion injuries. There are also stable spurring changes involve the calcaneus and the dorsal aspect of the talus. No osteochondral lesion.  IMPRESSION: No acute ankle fracture.  Suspect remote avulsion injuries.   Electronically Signed   By: Rudie Meyer M.D.   On: 01/22/2015 13:24   I have personally reviewed and evaluated these images and lab results as part of my medical decision-making.  Filed Vitals:   01/22/15 1217  BP: 145/109  Pulse: 82  Temp: 98.5 F (36.9 C)  Resp: 18     MDM   Final diagnoses:  Left ankle injury, initial encounter    Pt presents with complaint of left ankle pain s/p fall PTA. Denies LOC or head injury. History of prior left ankle fx (medial malleolous). VSS. Mild swelling at lateral malleolus, TTP. Left leg and foot neurovascularly intact. Ankle xray negative. Pt given ibuprofen and ice and ACE wrap placed in ED. Pt reports she has crutches at home. Plan to d/c home, given ortho follow up. Pt advised to rest,  ice and elevated left ankle and continue using ACE wrap, ice and ibuprofen for pain relief.   Evaluation does not show pathology requring ongoing emergent intervention or admission. Pt is hemodynamically stable and mentating appropriately. Discussed findings/results and plan with patient/guardian, who agrees with plan. All questions answered. Return precautions discussed and outpatient follow up given.    I personally performed the services described in this documentation, which was scribed in my presence. The recorded information has been reviewed and is accurate.    Satira Sark [place], [place] 01/22/15 1538  Elwin Mocha, MD 01/22/15 [phone]

## 2015-01-23 ENCOUNTER — Encounter: Payer: Self-pay | Admitting: Internal Medicine

## 2015-01-31 ENCOUNTER — Telehealth: Payer: Self-pay | Admitting: Internal Medicine

## 2015-01-31 NOTE — Telephone Encounter (Signed)
Patient would like to speak to PCP, regarding her leg injury. Patient would like to know if she would have to come here or go some where else. Please f/u with pt.

## 2015-01-31 NOTE — Telephone Encounter (Signed)
Patient called back stating that she also has bronchitis and is having pain in left leg. Please follow up with pt for advise.

## 2015-02-02 ENCOUNTER — Encounter (HOSPITAL_COMMUNITY): Payer: Self-pay | Admitting: Nurse Practitioner

## 2015-02-02 ENCOUNTER — Emergency Department (HOSPITAL_COMMUNITY)
Admission: EM | Admit: 2015-02-02 | Discharge: 2015-02-03 | Disposition: A | Discharge status: Home / Self Care | Payer: 59 | Attending: Emergency Medicine | Admitting: Emergency Medicine

## 2015-02-02 DIAGNOSIS — R202 Paresthesia of skin: Secondary | ICD-10-CM | POA: Diagnosis not present

## 2015-02-02 DIAGNOSIS — R300 Dysuria: Secondary | ICD-10-CM | POA: Insufficient documentation

## 2015-02-02 DIAGNOSIS — M79605 Pain in left leg: Secondary | ICD-10-CM | POA: Diagnosis present

## 2015-02-02 DIAGNOSIS — Z720 Tobacco use: Secondary | ICD-10-CM | POA: Insufficient documentation

## 2015-02-02 DIAGNOSIS — Z79899 Other long term (current) drug therapy: Secondary | ICD-10-CM | POA: Insufficient documentation

## 2015-02-02 DIAGNOSIS — I10 Essential (primary) hypertension: Secondary | ICD-10-CM | POA: Insufficient documentation

## 2015-02-02 DIAGNOSIS — M25572 Pain in left ankle and joints of left foot: Secondary | ICD-10-CM | POA: Insufficient documentation

## 2015-02-02 LAB — URINALYSIS, ROUTINE W REFLEX MICROSCOPIC
Bilirubin Urine: NEGATIVE
Glucose, UA: NEGATIVE mg/dL
Hgb urine dipstick: NEGATIVE
KETONES UR: NEGATIVE mg/dL
LEUKOCYTES UA: NEGATIVE
NITRITE: NEGATIVE
PROTEIN: NEGATIVE mg/dL
Specific Gravity, Urine: 1.037 — ABNORMAL HIGH (ref 1.005–1.030)
UROBILINOGEN UA: 1 mg/dL (ref 0.0–1.0)
pH: 5 (ref 5.0–8.0)

## 2015-02-02 LAB — BASIC METABOLIC PANEL
ANION GAP: 7 (ref 5–15)
BUN: 15 mg/dL (ref 6–20)
CALCIUM: 9.4 mg/dL (ref 8.9–10.3)
CO2: 25 mmol/L (ref 22–32)
Chloride: 107 mmol/L (ref 101–111)
Creatinine, Ser: 0.86 mg/dL (ref 0.44–1.00)
GFR calc non Af Amer: >60 mL/min (ref >60)
Glucose, Bld: 96 mg/dL (ref 65–99)
POTASSIUM: 3.6 mmol/L (ref 3.5–5.1)
SODIUM: 139 mmol/L (ref 135–145)

## 2015-02-02 LAB — CBC
HEMATOCRIT: 42.6 % (ref 36.0–46.0)
HEMOGLOBIN: 14.5 g/dL (ref 12.0–15.0)
MCH: 31 pg (ref 26.0–34.0)
MCHC: 34 g/dL (ref 30.0–36.0)
MCV: 91 fL (ref 78.0–100.0)
Platelets: 294 10*3/uL (ref 150–400)
RBC: 4.68 MIL/uL (ref 3.87–5.11)
RDW: 13 % (ref 11.5–15.5)
WBC: 13.6 10*3/uL — AB (ref 4.0–10.5)

## 2015-02-02 MED ORDER — KETOROLAC TROMETHAMINE 60 MG/2ML IM SOLN
60.0000 mg | Freq: Once | INTRAMUSCULAR | Status: AC
  Administered 2015-02-02: 60 mg via INTRAMUSCULAR
  Filled 2015-02-02: qty 2

## 2015-02-02 MED ORDER — PHENAZOPYRIDINE HCL 200 MG PO TABS
200.0000 mg | ORAL_TABLET | Freq: Three times a day (TID) | ORAL | Status: DC
  Administered 2015-02-02: 200 mg via ORAL
  Filled 2015-02-02: qty 1

## 2015-02-02 NOTE — ED Provider Notes (Signed)
CSN: [id number]     Arrival date & time 02/02/15  1941 History  By signing my name below, I, Brooke Ballard, attest that this documentation has been prepared under the direction and in the presence of Phenix Vandermeulen, MD. Electronically Signed: Ronney Ballard, ED Scribe. 02/02/2015. 12:28 PM.   Chief Complaint  Patient presents with  . Dysuria    With frequency  . Leg Pain    Left leg   Patient is a 44 y.o. female presenting with dysuria and leg pain. The history is provided by the patient. No language interpreter was used.  Dysuria Pain quality:  Burning Pain severity:  Moderate Onset quality:  Gradual Duration:  1 day Timing:  Constant Progression:  Unchanged Chronicity:  New Recent urinary tract infections: no   Relieved by:  None tried Worsened by:  Nothing tried Ineffective treatments:  None tried Urinary symptoms: no foul-smelling urine, no frequent urination and no bladder incontinence   Associated symptoms: no abdominal pain, no fever, no flank pain, no vaginal discharge and no vomiting   Risk factors: not sexually active   Leg Pain Associated symptoms: no fever and no neck pain    HPI Comments: Brooke Ballard is a 43 y.o. female who presents to the Emergency Department complaining of burning dysuria that began yesterday. Patient also complains of left leg pain that began after she fell 1.5 weeks ago. She states getting up and turning to the side causes an odd sensation in her left leg, as she states it seems to "get dizzy," "as if I have vertigo in my leg." Patient notes a history of laminectomy done by Dr. Noel Gerold 8 years ago. She denies any vaginal discharge. She denies any new sexual partners.  Past Medical History  Diagnosis Date  . Hypertension   . Borderline diabetes    Past Surgical History  Procedure Laterality Date  . Abdominal hysterectomy    . Back surgery     Family History  Problem Relation Age of Onset  . Hypertension Mother   . Hypertension Father    Social  History  Substance Use Topics  . Smoking status: Current Every Day Smoker -- 0.50 packs/day  . Smokeless tobacco: None     Comment: Smoking 1 ppd  . Alcohol Use: 0.0 oz/week    0 Standard drinks or equivalent per week     Comment: social   OB History    No data available     Review of Systems  Constitutional: Negative for fever.  Cardiovascular: Negative for leg swelling.  Gastrointestinal: Negative for vomiting and abdominal pain.  Genitourinary: Positive for dysuria. Negative for flank pain and vaginal discharge.  Musculoskeletal: Positive for arthralgias (left ankle pain). Negative for myalgias, joint swelling, gait problem and neck pain.  Neurological: Negative for dizziness, seizures, facial asymmetry, weakness, light-headedness, numbness and headaches.  All other systems reviewed and are negative.  Allergies  Other; Contrast media; and Morphine and related  Home Medications   Prior to Admission medications   Medication Sig Start Date End Date Taking? Authorizing Provider  aspirin-acetaminophen-caffeine (EXCEDRIN MIGRAINE) [phone] MG per tablet Take 2 tablets by mouth every 6 (six) hours as needed for headache.    Historical Provider, MD  butalbital-acetaminophen-caffeine (FIORICET) 50-325-40 MG per tablet Take 1 tablet by mouth every 6 (six) hours as needed for headache or migraine. Patient not taking: Reported on 01/14/2015 12/06/14 12/06/15  Fayrene Helper, PA-C  doxycycline (VIBRAMYCIN) 100 MG capsule Take 1 capsule (100 mg total) by mouth  2 (two) times daily. Patient not taking: Reported on 01/14/2015 08/10/14   Linna Hoff, MD  metoprolol tartrate (LOPRESSOR) 25 MG tablet Take 1 tablet (25 mg total) by mouth 2 (two) times daily. 11/27/14   Ambrose Finland, NP  Multiple Vitamin (MULTIVITAMIN) LIQD Take 15 mLs by mouth daily.    Historical Provider, MD  naproxen sodium (ANAPROX) 220 MG tablet Take 220 mg by mouth 2 (two) times daily as needed (FOR PAIN).    Historical Provider,  MD  verapamil (CALAN) 80 MG tablet Take 1 tablet (80 mg total) by mouth 2 (two) times daily. 12/09/14   Ambrose Finland, NP   BP 163/123 mmHg  Pulse 89  Temp(Src) 98.6 F (37 C) (Oral)  Resp 14  SpO2 100%  LMP 07/09/2012 Physical Exam  Constitutional: She is oriented to person, place, and time. She appears well-developed and well-nourished. No distress.  HENT:  Head: Normocephalic and atraumatic.  Right Ear: External ear normal.  Left Ear: External ear normal.  Mouth/Throat: Oropharynx is clear and moist. No oropharyngeal exudate.  Moist mm. No exudate. TMs are normal bilaterally.  Eyes: EOM are normal. Pupils are equal, round, and reactive to light.  Neck: Normal range of motion. Neck supple. No tracheal deviation present.  Trachea is midline.   Cardiovascular: Normal rate, regular rhythm, normal heart sounds and intact distal pulses.   Pulmonary/Chest: Effort normal and breath sounds normal. No respiratory distress. She has no wheezes. She has no rales.  Lungs are clear to auscultation.   Abdominal: Soft. She exhibits no mass. Bowel sounds are increased. There is no tenderness. There is no rebound and no guarding.  Hyperactive bowel sounds.   Musculoskeletal: Normal range of motion. She exhibits no edema or tenderness.       Left hip: Normal.       Left knee: Normal.       Left ankle: Normal. Achilles tendon normal.       Left upper leg: Normal.       Left foot: Normal.  BLE: 3+ DP and PT pulses intact. Skin is intact. Cap refill <2 seconds.  Achilles' tendon intact. All compartments of lower and upper leg are soft. Negative Homan's sign. Negative anterior and posterior drawer test. Negative valgus and varus test. Patellar tendon and quadriceps tendon are intact. No patellar alta or baja.   LLE: Full ROM of the hip. Hip flexors, extendors, abduction, and adduction are intact.   Neurological: She is alert and oriented to person, place, and time. She has normal reflexes. She displays  normal reflexes. No cranial nerve deficit. She exhibits normal muscle tone. Coordination normal.  Skin: Skin is warm and dry.  Psychiatric: She has a normal mood and affect.  Nursing note and vitals reviewed.   ED Course  Procedures (including critical care time)  DIAGNOSTIC STUDIES: Oxygen Saturation is 100% on RA, normal by my interpretation.    COORDINATION OF CARE: 11:22 PM - Discussed treatment plan with pt at bedside which includes referral to neurologist for nerve conduction test. Will try low dose of anti-inflammatories. Pt verbalized understanding and agreed to plan.   Labs Review Labs Reviewed  URINALYSIS, ROUTINE W REFLEX MICROSCOPIC (NOT AT [facility]) - Abnormal; Notable for the following:    APPearance CLOUDY (*)    Specific Gravity, Urine 1.037 (*)    All other components within normal limits  CBC - Abnormal; Notable for the following:    WBC 13.6 (*)    All other  components within normal limits  BASIC METABOLIC PANEL    MDM   Final diagnoses:  None   5/5 strength in the left lower extremity intact sensation vasculature and DTRs.  Has had a laminectomy so I doubt pathology starting in the back.  Is urinating and passing stool normally.  Will recommend nerve conduction tests and follow up with PMD.  Denies vaginal discharge or sexual activity.  Suspect bladder spasm.  Will treat with NSAIDs.  And pyridium and refer to neurology for nerve conduction test  I, Jacklyne Baik-RASCH,Jessicalynn Deshong K, personally performed the services described in this documentation. All medical record entries made by the scribe were at my direction and in my presence.  I have reviewed the chart and discharge instructions and agree that the record reflects my personal performance and is accurate and complete. Aylani Spurlock-RASCH,Esvin Hnat K.  02/03/2015. 12:37 AM.      Daulton Harbaugh, MD 02/03/15 1610

## 2015-02-02 NOTE — ED Notes (Signed)
Pt c/o of urinary frequency, with dysuria 6/10 pain score, reports left leg spasm which originates from her sacrum along her sciatic nerve. Denies any other symptoms.

## 2015-02-02 NOTE — ED Notes (Signed)
Pt reports having feeling "of vertigo that starts in her leg and spreads to her head" Pt has recently had a cold and still has a lingering cough. Pt reports feeling swimmy headed. Pt has weakness in her lower left extremity but is unremarkable everywhere else and is alert and oriented x 4 and has pupils that are reactive and equal

## 2015-02-03 ENCOUNTER — Telehealth: Payer: Self-pay | Admitting: Internal Medicine

## 2015-02-03 ENCOUNTER — Encounter (HOSPITAL_COMMUNITY): Payer: Self-pay | Admitting: Emergency Medicine

## 2015-02-03 MED ORDER — NAPROXEN 500 MG PO TABS: 500.0000 mg | ORAL_TABLET | Freq: Two times a day (BID) | ORAL | Status: DC

## 2015-02-03 MED ORDER — MECLIZINE HCL 25 MG PO TABS
12.5000 mg | ORAL_TABLET | Freq: Once | ORAL | Status: AC
  Administered 2015-02-03: 12.5 mg via ORAL
  Filled 2015-02-03: qty 1

## 2015-02-03 NOTE — Discharge Instructions (Signed)
Myelography  Myelography is an X-ray test that uses a special dye to look at your spine or neck. This test is usually done to look for:  · Spinal cord injury.  · Disk ruptures.  · Fluid-filled pockets of tissue (cysts) on your spinal cord or nerve roots.  · Tumors on your spinal cord or nerve roots.  BEFORE THE PROCEDURE  Arrange for someone to drive you home after the test.   PROCEDURE  · You will lie on your stomach during the procedure.  · Medicine may be given to you to help you relax.  · A numbing medicine will be applied to area that they will be injecting you with a needle.  · A needle will be inserted between two of your backbones.  · A special machine will be used to help your doctor guide the needle into the sac that surrounds your spinal cord and nerves. A special dye will be injected into this sac.  · The table you lie on may be moved around to make sure the dye moves all around your spinal cord and nerves.  · Pictures of the area will be taken by X-ray or CT.  AFTER THE PROCEDURE  · You will be taken to a recovery area.  · You will lie flat with your head in a raised position. This is to prevent a severe headache.     This information is not intended to replace advice given to you by your health care provider. Make sure you discuss any questions you have with your health care provider.     Document Released: 01/20/2008 Document Revised: 05/03/2014 Document Reviewed: 01/05/2012  Elsevier Interactive Patient Education ©2016 [employer]

## 2015-02-03 NOTE — Telephone Encounter (Signed)
Patient went to ED  On 02/02/15 and was advised to get a Myelography Procedure. The patient would like the details of the procedure and to make sure it is ordered. Please follow up with pt. Thank you.

## 2015-02-04 ENCOUNTER — Telehealth: Payer: Self-pay

## 2015-02-04 DIAGNOSIS — R531 Weakness: Secondary | ICD-10-CM

## 2015-02-04 NOTE — Telephone Encounter (Signed)
Patient called and requested an appointment for her PCP to follow up from ED f/u, facility does not have any appointments until next Wed. And patient requested something before that date.  Patient then asked if she should go back to the ED, please f/u with pt. For advice.

## 2015-02-04 NOTE — Telephone Encounter (Signed)
Spoke with patient about having a myelogram per the ED visit Instructed patient to make an appt to come to discuss these tests for her Left sided weakness Per ED they recommend a visit to neuro for evaluation Referral placed in Epic

## 2015-02-10 ENCOUNTER — Encounter: Payer: Self-pay | Admitting: Internal Medicine

## 2015-02-10 ENCOUNTER — Ambulatory Visit: Payer: 59 | Attending: Internal Medicine | Admitting: Internal Medicine

## 2015-02-10 VITALS — BP 157/111 | HR 72 | Temp 98.0°F | Resp 16 | Wt 280.0 lb

## 2015-02-10 DIAGNOSIS — R531 Weakness: Secondary | ICD-10-CM | POA: Insufficient documentation

## 2015-02-10 DIAGNOSIS — R29898 Other symptoms and signs involving the musculoskeletal system: Secondary | ICD-10-CM

## 2015-02-10 DIAGNOSIS — I10 Essential (primary) hypertension: Secondary | ICD-10-CM | POA: Diagnosis not present

## 2015-02-10 DIAGNOSIS — Z720 Tobacco use: Secondary | ICD-10-CM | POA: Diagnosis not present

## 2015-02-10 DIAGNOSIS — Z7982 Long term (current) use of aspirin: Secondary | ICD-10-CM | POA: Diagnosis not present

## 2015-02-10 DIAGNOSIS — Z23 Encounter for immunization: Secondary | ICD-10-CM | POA: Insufficient documentation

## 2015-02-10 DIAGNOSIS — Z79899 Other long term (current) drug therapy: Secondary | ICD-10-CM | POA: Insufficient documentation

## 2015-02-10 DIAGNOSIS — R7303 Prediabetes: Secondary | ICD-10-CM | POA: Insufficient documentation

## 2015-02-10 DIAGNOSIS — F17200 Nicotine dependence, unspecified, uncomplicated: Secondary | ICD-10-CM | POA: Insufficient documentation

## 2015-02-10 MED ORDER — VERAPAMIL HCL 120 MG PO TABS: 120.0000 mg | ORAL_TABLET | Freq: Two times a day (BID) | ORAL | Status: DC

## 2015-02-10 MED ORDER — NICOTINE 21 MG/24HR TD PT24: 21.0000 mg | MEDICATED_PATCH | Freq: Every day | TRANSDERMAL | Status: DC

## 2015-02-10 NOTE — Progress Notes (Signed)
Patient here for follow up from the ED Patient was seen for left leg spasms Patient was told she needs a nerve conduction test

## 2015-02-10 NOTE — Progress Notes (Signed)
Patient ID: Brooke Ballard, female   DOB: 04-07-1971, 44 y.o.   MRN: [id number]  CC: left leg weakness  HPI: Brooke Ballard is a 44 y.o. female here today for a follow up visit.  Patient has past medical history of HTN, prediabetes, and tobacco use. Patient reports that she was seen in the ER on 9/28 for symptoms of left sided weakness after she suffered a fall 2 weeks prior to that. Examination was completed and patient was referred to Neurology for a nerve conduction test. Patient states that the left leg feels "like jello/dizzy" when she moves it a certain way. Due to this sensation in her left lower leg she has suffered another fall.  Patient reports that she took her blood pressure medication last night around 1 am and has not taken the morning dose because she did not want to take the medication too close together. She reports continued headaches. Denies chest pain, palpitations, edema, blurred vision.    Allergies  Allergen Reactions  . Other Shortness Of Breath, Itching and Swelling    ALL STONE FRUITS   . Contrast Media [Iodinated Diagnostic Agents] Itching and Swelling  . Morphine And Related Itching and Swelling   Past Medical History  Diagnosis Date  . Hypertension   . Borderline diabetes    Current Outpatient Prescriptions on File Prior to Visit  Medication Sig Dispense Refill  . aspirin-acetaminophen-caffeine (EXCEDRIN MIGRAINE) 250-250-65 MG per tablet Take 2 tablets by mouth every 6 (six) hours as needed for headache.    . metoprolol tartrate (LOPRESSOR) 25 MG tablet Take 1 tablet (25 mg total) by mouth 2 (two) times daily. 60 tablet 2  . Multiple Vitamin (MULTIVITAMIN) LIQD Take 15 mLs by mouth daily.    . naproxen (NAPROSYN) 500 MG tablet Take 1 tablet (500 mg total) by mouth 2 (two) times daily with a meal. 14 tablet 0  . verapamil (CALAN) 80 MG tablet Take 1 tablet (80 mg total) by mouth 2 (two) times daily. 20 tablet 1  . butalbital-acetaminophen-caffeine (FIORICET)  50-325-40 MG per tablet Take 1 tablet by mouth every 6 (six) hours as needed for headache or migraine. (Patient not taking: Reported on 01/14/2015) 20 tablet 0  . doxycycline (VIBRAMYCIN) 100 MG capsule Take 1 capsule (100 mg total) by mouth 2 (two) times daily. (Patient not taking: Reported on 01/14/2015) 20 capsule 0  . naproxen sodium (ANAPROX) 220 MG tablet Take 220 mg by mouth 2 (two) times daily as needed (FOR PAIN).     Current Facility-Administered Medications on File Prior to Visit  Medication Dose Route Frequency Provider Last Rate Last Dose  . triamcinolone acetonide (KENALOG) 10 MG/ML injection 10 mg  10 mg Other Once Alvan Dame, DPM       Family History  Problem Relation Age of Onset  . Hypertension Mother   . Hypertension Father    Social History   Social History  . Marital Status: Divorced    Spouse Name: N/A  . Number of Children: N/A  . Years of Education: N/A   Occupational History  . Not on file.   Social History Main Topics  . Smoking status: Current Every Day Smoker -- 0.50 packs/day  . Smokeless tobacco: Not on file     Comment: Smoking 1 ppd  . Alcohol Use: 0.0 oz/week    0 Standard drinks or equivalent per week     Comment: social  . Drug Use: No  . Sexual Activity: Not on file   Other  Topics Concern  . Not on file   Social History Narrative    Review of Systems: Other than what is stated in HPI, all other systems are negative.   Objective:   Filed Vitals:   02/10/15 0940  BP: 157/111  Pulse: 72  Temp: 98 F (36.7 C)  Resp: 16    Physical Exam  Constitutional: She is oriented to person, place, and time.  Cardiovascular: Normal rate, regular rhythm, normal heart sounds and intact distal pulses.   Pulmonary/Chest: Effort normal and breath sounds normal.  Musculoskeletal: Normal range of motion. She exhibits no edema.  Neurological: She is alert and oriented to person, place, and time. No cranial nerve deficit.  Skin: Skin is warm and  dry.  Psychiatric: She has a normal mood and affect.     Lab Results  Component Value Date   WBC 13.6* 02/02/2015   HGB 14.5 02/02/2015   HCT 42.6 02/02/2015   MCV 91.0 02/02/2015   PLT 294 02/02/2015   Lab Results  Component Value Date   CREATININE 0.86 02/02/2015   BUN 15 02/02/2015   NA 139 02/02/2015   K 3.6 02/02/2015   CL 107 02/02/2015   CO2 25 02/02/2015    Lab Results  Component Value Date   HGBA1C 6.1* 07/18/2014   Lipid Panel     Component Value Date/Time   CHOL 178 07/18/2014 1025   TRIG 108 07/18/2014 1025   HDL 32* 07/18/2014 1025   CHOLHDL 5.6 07/18/2014 1025   VLDL 22 07/18/2014 1025   LDLCALC 124* 07/18/2014 1025       Assessment and plan:   Jillianna was seen today for follow-up.  Diagnoses and all orders for this visit:  Essential hypertension -     verapamil (CALAN) 120 MG tablet; Take 1 tablet (120 mg total) by mouth 2 (two) times daily. BP is still uncontrolled. I will increase her Verapamil to 120 mg BID and bring her back in 1 week for a BP recheck.   Left leg weakness Unsure of etiology, referral to Neurology was already placed.   Tobacco abuse -     nicotine (NICODERM CQ - DOSED IN MG/24 HOURS) 21 mg/24hr patch; Place 1 patch (21 mg total) onto the skin daily. I have congratulated patient on taking teh first steps to quitting. We discussed ways to remain successful.   Need for influenza vaccination -     Flu Vaccine QUAD 36+ mos PF IM (Fluarix & Fluzone Quad PF)   Return in about 1 week (around 02/17/2015) for Nurse Visit-BP check and 3 mo PCP .       Ambrose Finland, NP-C [facility] [phone] 02/10/2015, 9:53 AM

## 2015-02-10 NOTE — Patient Instructions (Signed)
Please take increased dose of Verapamil today when you get home. Now is 120 mg twice per day. Metoprolol is still the same dose

## 2015-02-18 ENCOUNTER — Ambulatory Visit: Payer: 59 | Attending: Internal Medicine | Admitting: Pharmacist

## 2015-02-18 ENCOUNTER — Encounter: Payer: Self-pay | Admitting: Pharmacist

## 2015-02-18 ENCOUNTER — Telehealth: Payer: Self-pay | Admitting: Pharmacist

## 2015-02-18 VITALS — BP 132/82 | HR 69

## 2015-02-18 DIAGNOSIS — Z79899 Other long term (current) drug therapy: Secondary | ICD-10-CM | POA: Insufficient documentation

## 2015-02-18 DIAGNOSIS — I10 Essential (primary) hypertension: Secondary | ICD-10-CM | POA: Insufficient documentation

## 2015-02-18 NOTE — Telephone Encounter (Signed)
Per Holland Commons, NP, since patient leg weakness has improved, it is possible that it was occurring due to the elevated blood pressure. Patient does not need to follow up with neurology if she prefers not to.  Spoke with patient on the phone and she reports that she thought about canceling her neurology appointment but since she still has some residual weakness, she has decided to keep the appointment just to make sure.   Juanita Craver, PharmD, BCPS, CPP Clinical Pharmacist Practitioner  [facility] [phone]

## 2015-02-18 NOTE — Progress Notes (Signed)
Stacy call patient and let her know that if she feels better, the weakness may have been related to her elevated BP as discussed. She can cancel appointment if needed

## 2015-02-18 NOTE — Progress Notes (Signed)
S:    Patient arrives in good spirits.  Presents to the clinic for hypertension evaluation.   Patient reports adherence with medications. However, she has not started the nicotine patch yet.   Current BP Medications include:  Verapamil 120 mg BID and metoprolol tartrate 25 mg BID.   Patient reports that her headaches have improved and that her leg weakness has also improved. She already has a neurology appointment scheduled for early November.   O:   Last 3 Office BP readings: BP Readings from Last 3 Encounters:  02/18/15 132/82  02/10/15 157/111  02/03/15 156/106    BMET    Component Value Date/Time   NA 139 02/02/2015 2053   K 3.6 02/02/2015 2053   CL 107 02/02/2015 2053   CO2 25 02/02/2015 2053   GLUCOSE 96 02/02/2015 2053   BUN 15 02/02/2015 2053   CREATININE 0.86 02/02/2015 2053   CREATININE 0.76 07/18/2014 1025   CALCIUM 9.4 02/02/2015 2053   GFRNONAA >60 02/02/2015 2053   GFRNONAA >89 07/18/2014 1025   GFRAA >60 02/02/2015 2053   GFRAA >89 07/18/2014 1025    A/P: History of hypertension currently controlled on current medications.  No recommendations for any changes at this time. Encouraged patient to continue to take her medications as prescribed and to refill her medications on time.  Patient to follow up with neurology and Holland Commons, NP, as directed.   Results reviewed and written information provided.   Total time in face-to-face counseling 15 minutes.

## 2015-02-18 NOTE — Patient Instructions (Signed)
Your blood pressure looks better today.  Remember that the most important thing that you can do for your health is to quit smoking - I am more than happy to help you when you are ready  Follow up with Brooke Ballard as directed

## 2015-02-27 ENCOUNTER — Encounter: Payer: Managed Care, Other (non HMO) | Admitting: Neurology

## 2015-04-02 ENCOUNTER — Encounter: Payer: 59 | Admitting: Neurology

## 2015-05-07 ENCOUNTER — Ambulatory Visit: Payer: Self-pay | Admitting: Internal Medicine

## 2015-07-31 ENCOUNTER — Emergency Department (HOSPITAL_COMMUNITY)
Admission: EM | Admit: 2015-07-31 | Discharge: 2015-07-31 | Disposition: A | Discharge status: Home / Self Care | Payer: Managed Care, Other (non HMO) | Attending: Emergency Medicine | Admitting: Emergency Medicine

## 2015-07-31 ENCOUNTER — Encounter (HOSPITAL_COMMUNITY): Payer: Self-pay | Admitting: Emergency Medicine

## 2015-07-31 DIAGNOSIS — F17200 Nicotine dependence, unspecified, uncomplicated: Secondary | ICD-10-CM | POA: Insufficient documentation

## 2015-07-31 DIAGNOSIS — R002 Palpitations: Secondary | ICD-10-CM | POA: Diagnosis not present

## 2015-07-31 DIAGNOSIS — Z79899 Other long term (current) drug therapy: Secondary | ICD-10-CM | POA: Diagnosis not present

## 2015-07-31 DIAGNOSIS — R000 Tachycardia, unspecified: Secondary | ICD-10-CM | POA: Diagnosis not present

## 2015-07-31 DIAGNOSIS — R0602 Shortness of breath: Secondary | ICD-10-CM | POA: Insufficient documentation

## 2015-07-31 DIAGNOSIS — R42 Dizziness and giddiness: Secondary | ICD-10-CM | POA: Diagnosis not present

## 2015-07-31 DIAGNOSIS — H538 Other visual disturbances: Secondary | ICD-10-CM | POA: Diagnosis not present

## 2015-07-31 DIAGNOSIS — I10 Essential (primary) hypertension: Secondary | ICD-10-CM | POA: Diagnosis not present

## 2015-07-31 MED ORDER — VERAPAMIL HCL 120 MG PO TABS
120.0000 mg | ORAL_TABLET | Freq: Once | ORAL | Status: AC
  Administered 2015-07-31: 120 mg via ORAL
  Filled 2015-07-31: qty 1

## 2015-07-31 MED ORDER — VERAPAMIL HCL 120 MG PO TABS: 120.0000 mg | ORAL_TABLET | Freq: Two times a day (BID) | ORAL | Status: DC

## 2015-07-31 MED ORDER — METOPROLOL TARTRATE 25 MG PO TABS: 25.0000 mg | ORAL_TABLET | Freq: Two times a day (BID) | ORAL | Status: DC

## 2015-07-31 MED ORDER — METOPROLOL TARTRATE 25 MG PO TABS
25.0000 mg | ORAL_TABLET | Freq: Once | ORAL | Status: AC
  Administered 2015-07-31: 25 mg via ORAL
  Filled 2015-07-31: qty 1

## 2015-07-31 NOTE — ED Provider Notes (Signed)
CSN: [id number]     Arrival date & time 07/31/15  1240 History   First MD Initiated Contact with Patient 07/31/15 1507     Chief Complaint  Patient presents with  . elevated HR      (Consider location/radiation/quality/duration/timing/severity/associated sxs/prior Treatment) HPI Comments: Patient is a 45 y.o. Female with significant PMHx of HTN, SVT, and tobacco use presents to the ED with palpitations and increased HR for 4 days. Patient states she has been out of her BP meds for one week and has an appointment to see her PCP tomorrow to have them refilled. She noticed intermittent palpations 4 days ago that have increased in intensity and quantity. When she woke today at 4AM she was soaked in sweat and she noticed her HR was elevated. At 11AM today she began to feel dizzy, lightheaded, SOB, with blurred vision. She tried a valsalva maneuver without success. She called her son to bring her to the ED. She has not taken any meds for this and nothing has made it better. She denies chest pain, nausea, or vomiting.  While in the ED her heart rate has decreased to normal although her BP remains elevated. She states she is no longer lightleaded, dizzy, SOB or having blurred vision.  The history is provided by the patient.    Past Medical History  Diagnosis Date  . Hypertension   . Borderline diabetes    Past Surgical History  Procedure Laterality Date  . Abdominal hysterectomy    . Back surgery     Family History  Problem Relation Age of Onset  . Hypertension Mother   . Hypertension Father    Social History  Substance Use Topics  . Smoking status: Current Every Day Smoker -- 0.50 packs/day  . Smokeless tobacco: None     Comment: Smoking 1 ppd  . Alcohol Use: 0.0 oz/week    0 Standard drinks or equivalent per week     Comment: social   OB History    No data available     Review of Systems  Constitutional: Negative for fever, chills and fatigue.  HENT: Negative for ear pain and  hearing loss.   Eyes: Positive for visual disturbance.  Respiratory: Positive for shortness of breath. Negative for cough and chest tightness.   Cardiovascular: Positive for palpitations. Negative for chest pain and leg swelling.  Gastrointestinal: Negative for nausea, abdominal pain and diarrhea.  Genitourinary: Negative for dysuria and hematuria.  Skin: Negative for color change, pallor and rash.  Neurological: Positive for dizziness and light-headedness. Negative for syncope, facial asymmetry, speech difficulty, weakness, numbness and headaches.  Psychiatric/Behavioral: Negative for confusion and agitation. The patient is not nervous/anxious.       Allergies  Other; Contrast media; and Morphine and related  Home Medications   Prior to Admission medications   Medication Sig Start Date End Date Taking? Authorizing Provider  BLACK CURRANT SEED OIL PO Take 2.5 mLs by mouth daily.   Yes Historical Provider, MD  metoprolol tartrate (LOPRESSOR) 25 MG tablet Take 1 tablet (25 mg total) by mouth 2 (two) times daily. Patient taking differently: Take 25 mg by mouth at bedtime.  11/27/14  Yes Ambrose Finland, NP  Multiple Vitamin (MULTIVITAMIN) LIQD Take 15 mLs by mouth daily.   Yes Historical Provider, MD  verapamil (CALAN) 120 MG tablet Take 1 tablet (120 mg total) by mouth 2 (two) times daily. 02/10/15  Yes Ambrose Finland, NP  butalbital-acetaminophen-caffeine (FIORICET) 50-325-40 MG per tablet Take 1  tablet by mouth every 6 (six) hours as needed for headache or migraine. Patient not taking: Reported on 01/14/2015 12/06/14 12/06/15  Fayrene Helper, PA-C  naproxen (NAPROSYN) 500 MG tablet Take 1 tablet (500 mg total) by mouth 2 (two) times daily with a meal. Patient not taking: Reported on 02/18/2015 02/03/15   April Palumbo, MD  nicotine (NICODERM CQ - DOSED IN MG/24 HOURS) 21 mg/24hr patch Place 1 patch (21 mg total) onto the skin daily. Patient not taking: Reported on 02/18/2015 02/10/15   Ambrose Finland, NP   BP 167/100 mmHg  Pulse 63  Temp(Src) 98.1 F (36.7 C) (Oral)  Resp 15  SpO2 97%  LMP 07/09/2012 Physical Exam  Constitutional: She is oriented to person, place, and time. She appears well-developed and well-nourished. No distress.  HENT:  Head: Normocephalic and atraumatic.  Eyes: Conjunctivae are normal.  Cardiovascular: Normal rate, regular rhythm, normal heart sounds and intact distal pulses.   Pulmonary/Chest: Effort normal and breath sounds normal. No respiratory distress. She has no wheezes.  Musculoskeletal: She exhibits no edema.  Neurological: She is alert and oriented to person, place, and time. Coordination normal.  PERRL  Skin: Skin is warm and dry.  Psychiatric: She has a normal mood and affect. Her behavior is normal.    ED Course  Procedures (including critical care time) Labs Review Labs Reviewed - No data to display  Imaging Review No results found. I have personally reviewed and evaluated these images and lab results as part of my medical decision-making.   EKG Interpretation   Date/Time:  Thursday July 31 2015 12:57:06 EDT Ventricular Rate:  73 PR Interval:  205 QRS Duration: 111 QT Interval:  411 QTC Calculation: 453 R Axis:   2 Text Interpretation:  Sinus rhythm Borderline prolonged PR interval Low  voltage, precordial leads Baseline wander in lead(s) V4 No significant  change since last tracing Confirmed by NGUYEN, EMILY ([id number]) on 07/31/2015  7:50:52 PM      MDM   Final diagnoses:  Tachycardia  Palpitations   Patient presented to the ED with complaints of tachycardia and palpations. She has not taken her BP meds in roughly one week. Her EKG was reviewed and no signs of ischemia and during her time in the ED her symptoms including palpations, SOB, dizziness has resolved. There was no witnessed episodes of tachycardia. She was noted to be hypertensive but no signs of hypertensive urgency. We have given her a dose of her BP meds to  include Verapamil and metropolol and have given her a perscription for one week of both until she can follow up with her PCP. At the time of discharge the patient was stable.  I discussed all of the results with the patient and she has expressed full understanding to the verbal discharge instructions.    Jerre Simon, PA 07/31/15 1954  Leta Baptist, MD 08/06/15 [phone]

## 2015-07-31 NOTE — Discharge Instructions (Signed)
You came to the ED with increased heart rate and palpations. Please take your medications as prescribed.  Please see your PCP within 2 days for follow up. If you notice increased heart rate, palpations, dizziness, SOB please return to the ED.    Paroxysmal Supraventricular Tachycardia Paroxysmal supraventricular tachycardia (PSVT) is a type of abnormal heart rhythm. It causes your heart to beat very quickly and then suddenly stop beating so quickly. A normal heart rate is 60-100 beats per minute. During an episode of PSVT, your heart rate may be 150-250 beats per minute. This can make you feel light-headed and short of breath. An episode of PSVT can be frightening. It is usually not dangerous. The heart has four chambers. All chambers need to work together for the heart to beat effectively. A normal heartbeat usually starts in the right upper chamber of the heart (atrium) when an area (sinoatrial node) puts out an electrical signal that spreads to the other chambers. People with PSVT may have abnormal electrical pathways, or they may have other areas in the upper chambers that send out electrical signals. The result is a very rapid heartbeat. When your heart beats very quickly, it does not have time to fill completely with blood. When PSVT happens often or it lasts for long periods, it can lead to heart weakness and failure. Most people with PSVT do not have any other heart disease. CAUSES Abnormal electrical activity in the heart causes PSVT. It is not known why some people get PSVT and others do not. RISK FACTORS You may be more likely to have PSVT if:  You are 17-51 years old.  You are a woman. Other factors that may increase your chances of an attack include:  Stress.  Being tired.  Smoking.  Stimulant drugs.  Alcoholic drinks.  Caffeine.  Pregnancy. SIGNS AND SYMPTOMS A mild episode of PSVT may cause no symptoms. If you do have signs and symptoms, they may include:  A pounding  heart.  Feeling of skipped heartbeats (palpitations).  Weakness.  Shortness of breath.  Tightness or pain in your chest.  Light-headedness.  Anxiety.  Dizziness.  Sweating.  Nausea.  A fainting spell. DIAGNOSIS Your health care provider may suspect PSVT if you have symptoms that come and go. The health care provider will do a physical exam. If you are having an episode during the exam, the health care provider may be able to diagnose PSVT by listening to your heart and feeling your pulse. Tests may also be done, including:  An electrical study of your heart (electrocardiogram, or ECG).  A test in which you wear a portable ECG monitor all day (Holter monitor) or for several days (event monitor).  A test that involves taking an image of your heart using sound waves (echocardiogram) to rule out other causes of a fast heart rate. TREATMENT You may not need treatment if episodes of PSVT do not happen often or if they do not cause symptoms. If PSVT episodes do cause symptoms, your health care provider may first suggest trying a self-treatment called vagus nerve stimulation. The vagus nerve extends down from the brain. It regulates certain body functions. Stimulating this nerve can slow down the heart. Your health care provider can teach you ways to do this. You may need to try a few ways to find what works best for you. Options include:  Holding your breath and pushing, as though you are having a bowel movement.  Massaging an area on one side of  your neck below your jaw.  Bending forward with your head between your legs.  Bending forward with your head between your legs and coughing.  Massaging your eyeballs with your eyes closed. If vagus nerve stimulation does not work, other treatment options include:  Medicines to prevent an attack.  Being treated in the hospital with medicine or electric shock to stop an attack (cardioversion). This treatment can include:  Getting  medicine through an IV line.  Having a small electric shock delivered to your heart. You will be given medicine to make you sleep through this procedure.  If you have frequent episodes with symptoms, you may need a procedure to get rid of the faulty areas of your heart (radiofrequency ablation) and end the episodes of PSVT. In this procedure:  A long, thin tube (catheter) is passed through one of your veins into your heart.  Energy directed through the catheter eliminates the areas of your heart that are causing abnormal electric stimulation. HOME CARE INSTRUCTIONS  Take medicines only as directed by your health care provider.  Do not use caffeine in any form if caffeine triggers episodes of PSVT. Otherwise, consume caffeine in moderation. This means no more than a few cups of coffee or the equivalent each day.  Do not drink alcohol if alcohol triggers episodes of PSVT. Otherwise, limit alcohol intake to no more than 1 drink per day for nonpregnant women and 2 drinks per day for men. One drink equals 12 ounces of beer, 5 ounces of wine, or 1 ounces of hard liquor.  Do not use any tobacco products, including cigarettes, chewing tobacco, or electronic cigarettes. If you need help quitting, ask your health care provider.  Try to get at least 7 hours of sleep each night.  Find healthy ways to manage stress.  Perform vagus nerve stimulation as directed by your health care provider.  Maintain a healthy weight.  Get some exercise on most days. Ask your health care provider to suggest some good activities for you. SEEK MEDICAL CARE IF:  You are having episodes of PSVT more often, or they are lasting longer.  Vagus nerve stimulation is no longer helping.  You have new symptoms during an episode. SEEK IMMEDIATE MEDICAL CARE IF:  You have chest pain or trouble breathing.  You have an episode of PSVT that has lasted longer than 20 minutes.  You have passed out from an episode of  PSVT. These symptoms may represent a serious problem that is an emergency. Do not wait to see if the symptoms will go away. Get medical help right away. Call your local emergency services (911 in the [place] Do not drive yourself to the hospital.   This information is not intended to replace advice given to you by your health care provider. Make sure you discuss any questions you have with your health care provider.   Document Released: 04/12/2005 Document Revised: 05/03/2014 Document Reviewed: 09/20/2013 Elsevier Interactive Patient Education [employer]

## 2015-07-31 NOTE — ED Notes (Signed)
Per pt, states she has a history of SVT, has not taking meds-states has felt heart racing on and off since Monday

## 2016-07-12 ENCOUNTER — Ambulatory Visit: Payer: Managed Care, Other (non HMO) | Admitting: Obstetrics and Gynecology

## 2017-02-02 ENCOUNTER — Encounter (HOSPITAL_COMMUNITY): Payer: Self-pay | Admitting: Emergency Medicine

## 2017-02-02 DIAGNOSIS — R002 Palpitations: Secondary | ICD-10-CM | POA: Insufficient documentation

## 2017-02-02 DIAGNOSIS — Z5321 Procedure and treatment not carried out due to patient leaving prior to being seen by health care provider: Secondary | ICD-10-CM | POA: Insufficient documentation

## 2017-02-02 NOTE — ED Triage Notes (Signed)
Pt states she has been having heart palpitations and blurred vision in her left eye off and on for the past 2 weeks  Pt states the palpitations are becoming more frequent   Pt states she took her blood pressure at the [facility] and her SBP was 210

## 2017-02-03 ENCOUNTER — Emergency Department (HOSPITAL_COMMUNITY)
Admission: EM | Admit: 2017-02-03 | Discharge: 2017-02-03 | Disposition: A | Discharge status: Home / Self Care | Payer: Managed Care, Other (non HMO) | Attending: Emergency Medicine | Admitting: Emergency Medicine

## 2017-02-03 NOTE — ED Triage Notes (Signed)
Pt called  No response from lobby  

## 2017-02-03 NOTE — ED Notes (Signed)
I called patient name for a room and no one answered

## 2017-02-03 NOTE — ED Notes (Signed)
Called  No response from lobby 

## 2017-02-07 ENCOUNTER — Ambulatory Visit (HOSPITAL_COMMUNITY)
Admission: EM | Admit: 2017-02-07 | Discharge: 2017-02-07 | Disposition: A | Discharge status: Home / Self Care | Payer: Managed Care, Other (non HMO) | Attending: Emergency Medicine | Admitting: Emergency Medicine

## 2017-02-07 ENCOUNTER — Encounter (HOSPITAL_COMMUNITY): Payer: Self-pay | Admitting: Emergency Medicine

## 2017-02-07 DIAGNOSIS — I10 Essential (primary) hypertension: Secondary | ICD-10-CM

## 2017-02-07 DIAGNOSIS — Z760 Encounter for issue of repeat prescription: Secondary | ICD-10-CM

## 2017-02-07 DIAGNOSIS — H539 Unspecified visual disturbance: Secondary | ICD-10-CM

## 2017-02-07 MED ORDER — VERAPAMIL HCL 120 MG PO TABS: 120.0000 mg | ORAL_TABLET | Freq: Two times a day (BID) | ORAL | 1 refills | Status: DC

## 2017-02-07 NOTE — ED Triage Notes (Addendum)
Pt ran out of her BP medication about two weeks ago.  Since then she reports a BP of 210/138 and 172/124 at two different pharmacies.  She also reports cloudiness to her left eye, and has not called her eye doctor for this.  Pt has an URI at this time.  I called [facility] to confirm what BP medications she may have been on per her report that this is her pharmacy.  Pt states she thought she was taking HCTZ.  Pharmacy reports:  120 mg Verapamil BID that was filled in 8/18. Amlodipine that was last filled in 1/18. Micardis HCT was last filled in 2017.

## 2017-02-07 NOTE — Discharge Instructions (Signed)
Decrease your salt intake. diet and exercise will lower your blood pressure significantly. It is important to keep your blood pressure under good control, as having a elevated for prolonged periods of time significantly increases your risk of stroke, heart attacks, kidney damage, eye damage, and other problems. Measure your blood pressure once a day, preferably at the same time every day. Keep a log of this and bring it to your next doctor's appointment. Return here in 2 weeks for blood pressure recheck if you're able to find a primary care physician by then. Also make sure you bring her blood pressure cuff in with use of that we can make sure you're obtaining accurate home readings. Return immediately to the ER if you start having chest pain, headache, problems seeing, problems talking, problems walking, if you feel like you're about to pass out, if you do pass out, if you have a seizure, or for any other concerns.  Go to www.goodrx.com to look up your medications. This will give you a list of where you can find your prescriptions at the most affordable prices. Or ask the pharmacist what the cash price is, or if they have any other discount programs available to help make your medication more affordable. This can be less expensive than what you would pay with insurance.

## 2017-02-07 NOTE — ED Provider Notes (Signed)
HPI  SUBJECTIVE:  Brooke Ballard is a 46 y.o. female who presents with 2 issues. First she needs a refill of her blood pressure medicines. States that she has been measuring at the pharmacy and his been running 200's- 170s over 130s-120's. Per pharmacy, she last filled verapamil 120 mg twice a day. Has also been on metoprolol and Micardis in the past.not entirely sure what her current meds are, states verapamil seems familiar. She is unsure if she was on additional medication thinks perhaps it was hydrochlorothiazide. states that they controlled her blood pressure well. States that she ran out 2 weeks ago. Denies headache, chest pain, shortness of breath, pain tearing through to her back, dysarthria, arm or leg weakness, facial droop, lower extremity edema, anuria, hematuria. She is taking Alka-Seltzer cold medicine/decongestant for current URI. Denies cocaine use.   she reports constant left eye "haziness" for the past 3 weeks. She states that she was having symptomsin her eye prior to running out of her blood pressure medicines. She states that she has trouble focusing close up with her left eye. No spots, blurry vision, eye pain, discharge, drainage, crusting, photophobia. States her vision was tested in April 2018 in her vision was equal at that time. She uses readers. She does not wear glasses or contacts for distance.  PCP: None.   Past Medical History:  Diagnosis Date  . Borderline diabetes   . Hypertension     Past Surgical History:  Procedure Laterality Date  . ABDOMINAL HYSTERECTOMY    . BACK SURGERY    . c section x 2      Family History  Problem Relation Age of Onset  . Hypertension Mother   . Hypertension Father     Social History  Substance Use Topics  . Smoking status: Current Every Day Smoker    Packs/day: 0.50  . Smokeless tobacco: Never Used     Comment: Smoking 1 ppd  . Alcohol use No     Comment: social    No current facility-administered medications for this  encounter.   Current Outpatient Prescriptions:  .  BLACK CURRANT SEED OIL PO, Take 2.5 mLs by mouth daily., Disp: , Rfl:  .  Multiple Vitamin (MULTIVITAMIN) LIQD, Take 15 mLs by mouth daily., Disp: , Rfl:  .  verapamil (CALAN) 120 MG tablet, Take 1 tablet (120 mg total) by mouth 2 (two) times daily., Disp: 60 tablet, Rfl: 1  Allergies  Allergen Reactions  . Other Shortness Of Breath, Itching and Swelling    ALL STONE FRUITS   . Contrast Media [Iodinated Diagnostic Agents] Itching and Swelling  . Morphine And Related Itching and Swelling     ROS  As noted in HPI.   Physical Exam  BP (!) 173/115 (BP Location: Left Arm)   Pulse 83   Temp 99.3 F (37.4 C) (Oral)   LMP 07/09/2012   SpO2 96%   Constitutional: Well developed, well nourished, no acute distress Eyes:  PERRLA EOMI, conjunctiva normal bilaterally. No direct or consensual photophobia. No foreign body seen with lid eversion. No drainage. No periorbital erythema, edema.   Visual Acuity  Right Eye Distance: 20/25 Left Eye Distance: 20/40 Bilateral Distance: 20/25  Right Eye Near:   Left Eye Near:    Bilateral Near:    HENT: Normocephalic, atraumatic,mucus membranes moist Respiratory: Normal inspiratory effort lungs clear bilaterally Cardiovascular: Normal rate. Regular rhythm no murmurs, rubs, gallops GI: nondistended skin: No rash, skin intact Musculoskeletal: no deformities. No lower extremity edema  bilaterally Neurologic: Alert & oriented x 3, no focal neuro deficits Psychiatric: Speech and behavior appropriate   ED Course   Medications - No data to display  Orders Placed This Encounter  Procedures  . Visual acuity screening    Standing Status:   Standing    Number of Occurrences:   1    No results found for this or any previous visit (from the past 24 hour(s)). No results found.  ED Clinical Impression  Essential hypertension - Plan: verapamil (CALAN) 120 MG tablet  Medication  refill  Visual changes  ED Assessment/Plan  Outside records reviewed. She was on verapamil 120 mg twice a day metoprolol tartrate 25 mg twice a day in 01/2015.  RN called [facility], patient was on 120 mg of verapamil twice a day back in August 2018 the patient thinks that she was on hydrochlorothiazide but does not remember the dose. Pharmacy does not have a record of this. Will restart the verapamil as she states this controlled her blood pressure well. withholding second anti-hypertensive today as that she does not know exactly what it is.  She states that she is getting a blood pressure cuff today. Advised her to keep a log of her blood pressures, to measure it once a day, preferably at the same time every day. She will keep a log of her blood pressures and bring it into her next visit either with a primary care physician or here in 2 weeks. She will also bring her blood pressure cuff in to make sure that it is obtaining accurate readings.  Pt hypertensive today. States BP has been running in this range recently.  Has not taken BP meds in 2 weeks Pt has no evidence of end organ damage. Pt denies any CNS type sx such as HA, visual changes, focal paresis, or new onset seizure activity. Pt denies any CV sx such as CP, dyspnea, palpitations, pedal edema, tearing pain radiating to back or abd. Pt denied any renal sx such as anuria or hematuria. Pt denies illicit drug use, most notably cocaine. is taking over-the-counter cold medicine. Discussed with her to try some cold medicines made for people with high blood pressure. Discussed the importance of taking usual BP medications. Pt to f/u as OP. Providing primary care referral.   As for her eye, she does not have conjunctivitis, glaucoma, she does have a unequal visual acuity. She states her visual acuity was equal back in April. Think that her symptoms are from this visual change. She was having problems with her eye prior to running out of her blood pressure  medicine, so do not think that the eyesymptoms is from hypertensive emergency. she will follow-up with Dr. Caryn Section, her eye doctor, regarding this.  Discussed MDM, plan and followup with patient. Discussed sn/sx that should prompt return to the ED. Patient agrees with plan.   Meds ordered this encounter  Medications  . verapamil (CALAN) 120 MG tablet    Sig: Take 1 tablet (120 mg total) by mouth 2 (two) times daily.    Dispense:  60 tablet    Refill:  1    *This clinic note was created using Scientist, clinical (histocompatibility and immunogenetics). Therefore, there may be occasional mistakes despite careful proofreading.  ?   Domenick Gong, MD 02/08/17 [phone]

## 2017-02-28 IMAGING — CR DG ANKLE COMPLETE 3+V*L*
3 series · 3 of 3 positions shown · non-contrast
Comparison: Left foot radiographs 11/28/2013

CLINICAL DATA: Fell downstairs today.  Left ankle pain.

EXAM:
LEFT ANKLE COMPLETE - 3+ VIEW

[x ankle ap left]
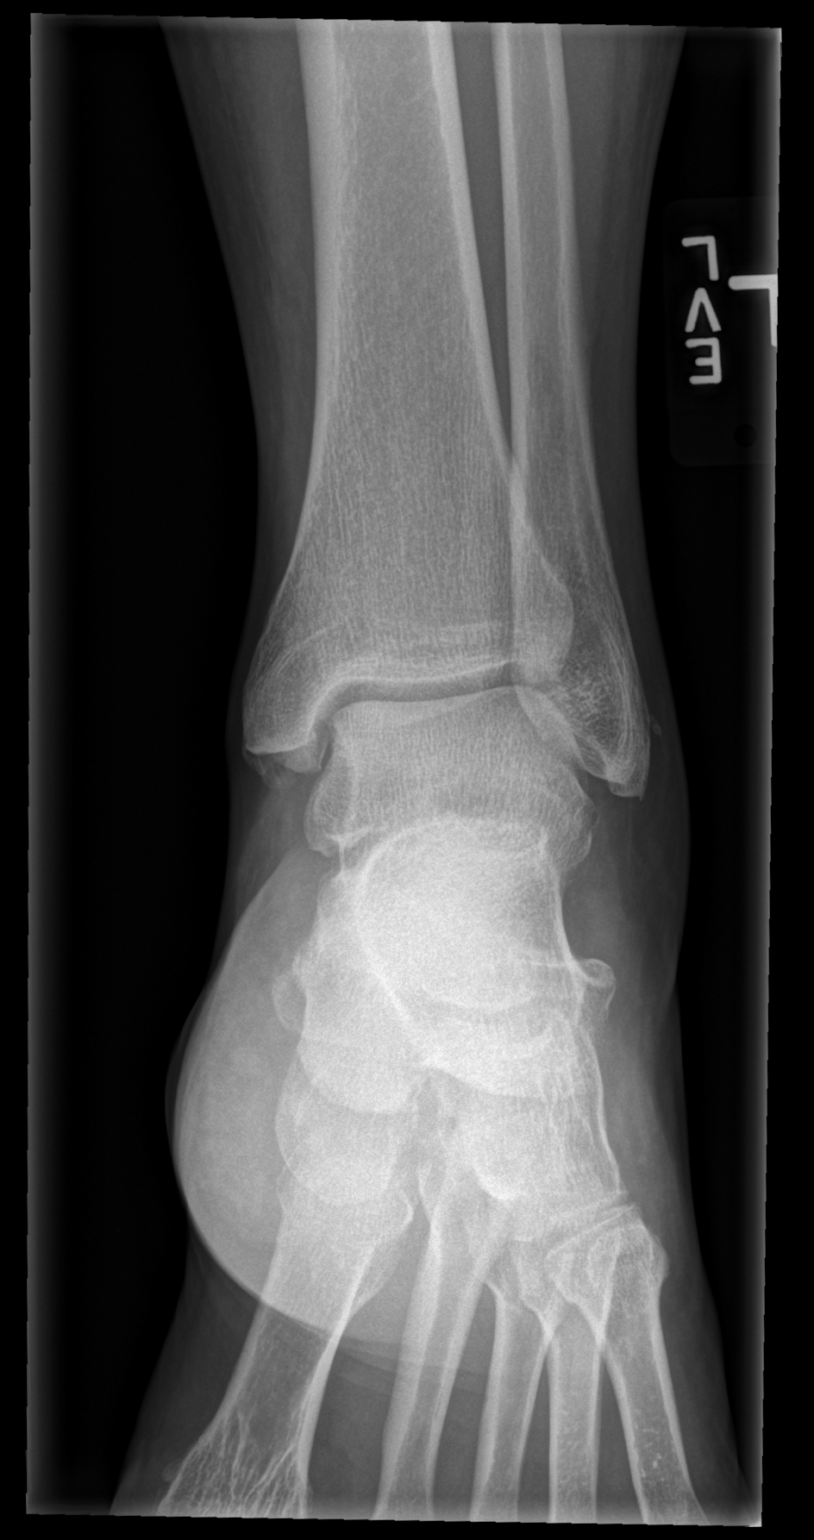

[x ankle obl left]
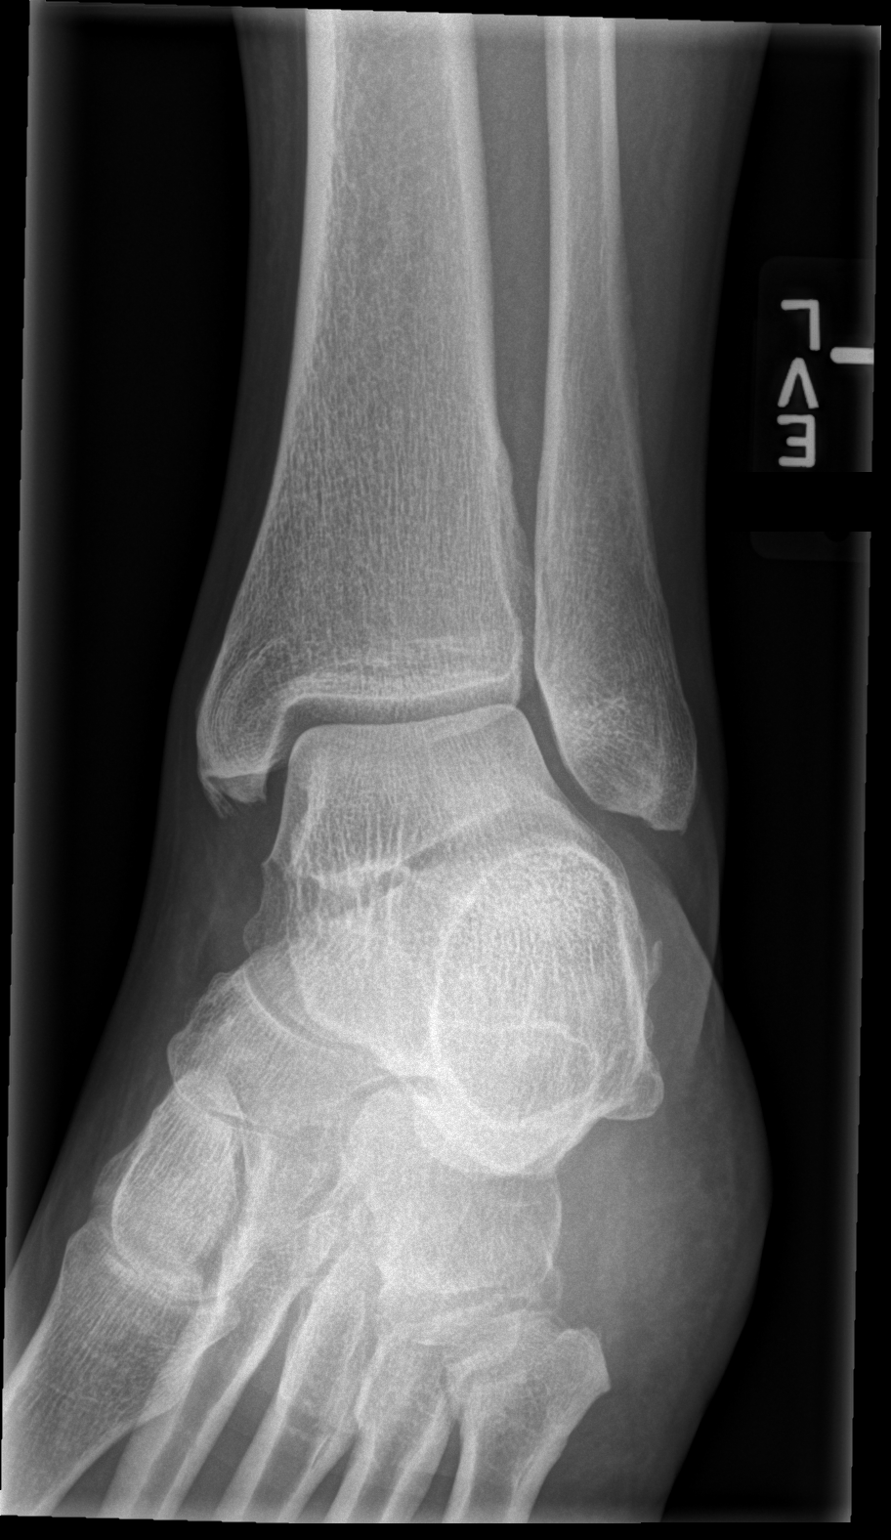

[x ankle lat left]
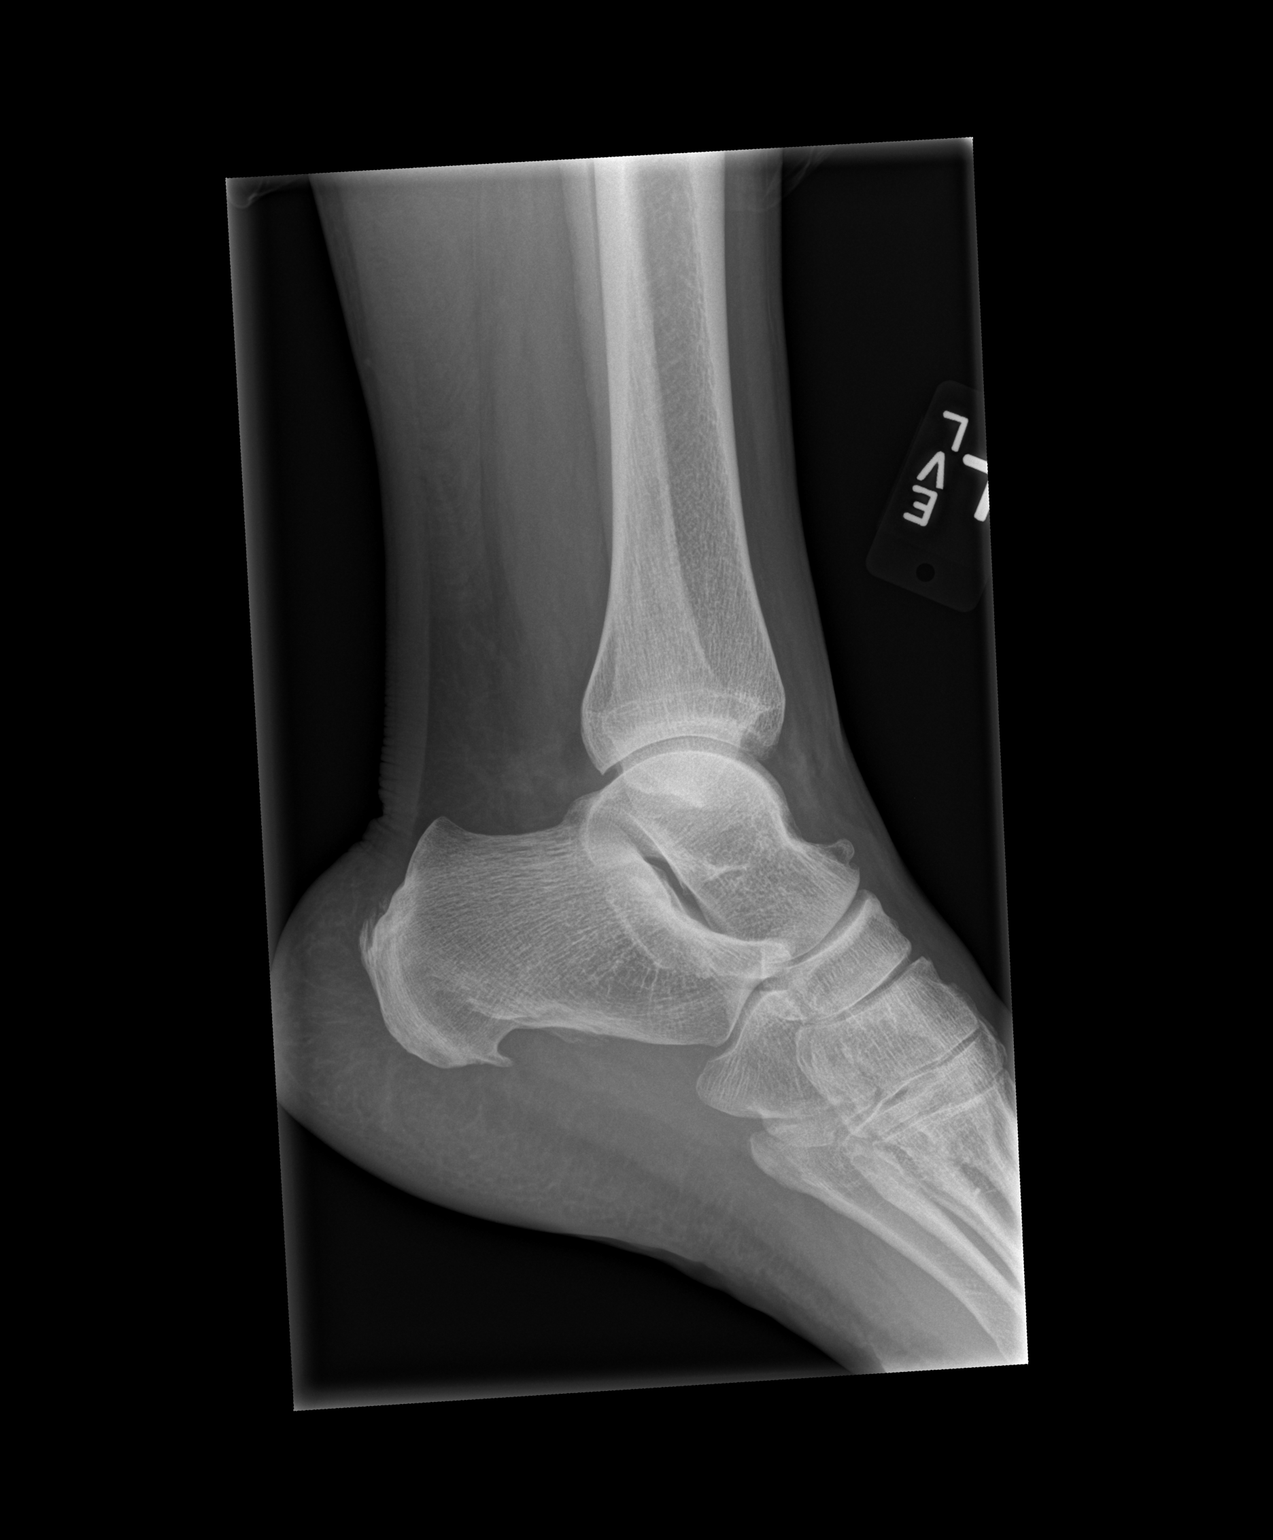

[3 of 3 positions shown; findings below may reference images not displayed]

FINDINGS: The ankle mortise is maintained. No acute ankle fracture. Small bony
densities near the medial and lateral malleolar I are likely remote
avulsion injuries. There are also stable spurring changes involve
the calcaneus and the dorsal aspect of the talus. No osteochondral
lesion.
IMPRESSION: No acute ankle fracture.  Suspect remote avulsion injuries.

## 2017-07-12 ENCOUNTER — Emergency Department (HOSPITAL_COMMUNITY)
Admission: EM | Admit: 2017-07-12 | Discharge: 2017-07-12 | Disposition: A | Discharge status: Home / Self Care | Payer: 59 | Attending: Emergency Medicine | Admitting: Emergency Medicine

## 2017-07-12 ENCOUNTER — Other Ambulatory Visit: Payer: Self-pay

## 2017-07-12 DIAGNOSIS — R040 Epistaxis: Secondary | ICD-10-CM | POA: Diagnosis present

## 2017-07-12 DIAGNOSIS — R030 Elevated blood-pressure reading, without diagnosis of hypertension: Secondary | ICD-10-CM | POA: Insufficient documentation

## 2017-07-12 DIAGNOSIS — J111 Influenza due to unidentified influenza virus with other respiratory manifestations: Secondary | ICD-10-CM | POA: Diagnosis not present

## 2017-07-12 DIAGNOSIS — F17210 Nicotine dependence, cigarettes, uncomplicated: Secondary | ICD-10-CM | POA: Diagnosis not present

## 2017-07-12 DIAGNOSIS — R0981 Nasal congestion: Secondary | ICD-10-CM | POA: Diagnosis not present

## 2017-07-12 DIAGNOSIS — R05 Cough: Secondary | ICD-10-CM | POA: Insufficient documentation

## 2017-07-12 DIAGNOSIS — Z79899 Other long term (current) drug therapy: Secondary | ICD-10-CM | POA: Diagnosis not present

## 2017-07-12 DIAGNOSIS — R6889 Other general symptoms and signs: Secondary | ICD-10-CM

## 2017-07-12 MED ORDER — OXYMETAZOLINE HCL 0.05 % NA SOLN
1.0000 | Freq: Once | NASAL | Status: AC
  Administered 2017-07-12: 1 via NASAL
  Filled 2017-07-12: qty 15

## 2017-07-12 NOTE — Discharge Instructions (Signed)
Use the Afrin nasal spray twice a day for 3 days. Take the medications your doctor gave you today as  directed. Be sure you are taking your blood pressure medication as directed. If your blood pressure continues to be elevated when taking your medication correctly discuss it with your doctor. Return as needed for worsening symptoms.

## 2017-07-12 NOTE — ED Notes (Signed)
Upon reassessment patients nose bleed has stopped, patient still would like to be seen.

## 2017-07-12 NOTE — ED Provider Notes (Signed)
[facility]-EMERGENCY DEPT Provider Note   CSN: [id number] Arrival date & time: 07/12/17  1711     History   Chief Complaint Chief Complaint  Patient presents with  . Epistaxis    HPI Trinnity Breunig is a 47 y.o. female who presents to the ED for nose bleed. Patient reports that she has had cough and congestion and saw her doctor today and was started on Tamiflu even though her test was negative due to patient's high blood pressure. Patient reports she went home and was was sneezing and her nose started bleeding. She went back to the doctor and he said he could not see where the bleeding was coming from and for her to go to the ED. While patient was waiting the bleeding stopped.  HPI  Past Medical History:  Diagnosis Date  . Borderline diabetes   . Hypertension     Patient Active Problem List   Diagnosis Date Noted  . Cluster headache 12/10/2014  . Tobacco use disorder 07/18/2014  . HTN (hypertension) 03/20/2014    Past Surgical History:  Procedure Laterality Date  . ABDOMINAL HYSTERECTOMY    . BACK SURGERY    . c section x 2      OB History    No data available       Home Medications    Prior to Admission medications   Medication Sig Start Date End Date Taking? Authorizing Provider  BLACK CURRANT SEED OIL PO Take 2.5 mLs by mouth daily.    [provider]  Multiple Vitamin (MULTIVITAMIN) LIQD Take 15 mLs by mouth daily.    [provider]  verapamil (CALAN) 120 MG tablet Take 1 tablet (120 mg total) by mouth 2 (two) times daily. 02/07/17 03/09/17  Domenick Gong, MD    Family History Family History  Problem Relation Age of Onset  . Hypertension Mother   . Hypertension Father     Social History Social History   Tobacco Use  . Smoking status: Current Every Day Smoker    Packs/day: 0.50  . Smokeless tobacco: Never Used  . Tobacco comment: Smoking 1 ppd  Substance Use Topics  . Alcohol use: No    Alcohol/week:  0.0 oz    Comment: social  . Drug use: No     Allergies   Other; Contrast media [iodinated diagnostic agents]; and Morphine and related   Review of Systems Review of Systems  Constitutional: Positive for chills and fever.  HENT: Positive for congestion, nosebleeds and sinus pressure.   Respiratory: Positive for cough.   Gastrointestinal: Negative for nausea and vomiting.  Musculoskeletal: Positive for myalgias.  Skin: Negative for rash.  Neurological: Negative for headaches.  Psychiatric/Behavioral: Negative for confusion.     Physical Exam Updated Vital Signs BP (!) 159/114 (BP Location: Right Arm)   Pulse 75   Temp 98.2 F (36.8 C) (Oral)   Resp 16   Ht 5\' 8"  (1.727 m)   Wt 129.3 kg (285 lb)   LMP 07/09/2012   SpO2 100%   BMI 43.33 kg/m   Physical Exam  Constitutional: She appears well-developed and well-nourished. No distress.  HENT:  Head: Normocephalic.  Nose: Mucosal edema and rhinorrhea present.  No active bleeding from the nose at this time.  Eyes: EOM are normal.  Neck: Neck supple.  Cardiovascular: Normal rate.  Pulmonary/Chest: Effort normal. No respiratory distress.  Musculoskeletal: Normal range of motion.  Neurological: She is alert.  Skin: Skin is warm and dry.  Psychiatric: She has a normal mood and affect.  Nursing note and vitals reviewed.    ED Treatments / Results  Labs (all labs ordered are listed, but only abnormal results are displayed) Labs Reviewed - No data to display Radiology No results found.  Procedures Procedures (including critical care time)  Medications Ordered in ED Medications  oxymetazoline (AFRIN) 0.05 % nasal spray 1 spray (1 spray Each Nare Given 07/12/17 1932)     Initial Impression / Assessment and Plan / ED Course  I have reviewed the triage vital signs and the nursing notes. 47 y.o. female sent here, by the doctor that treated her earlier today for influenza, for nose bleed that stopped after patient  arrived to the ED. Afrin Nasal spray used to decrease swelling in the nose while in the ED. Patient to use the Afrin BID x 3 days. She will also use a cool mist vaporizer. She will continue to take the medications her doctor prescribed for her earlier today for her flu like symptoms. Discussed with the patient elevated BP and need for taking her medication as prescribed and f/u with her PCP if her reading continue to be high. Patient agrees with plan. Return precautions discussed.   Final Clinical Impressions(s) / ED Diagnoses   Final diagnoses:  Anterior epistaxis  Flu-like symptoms  Elevated blood pressure reading    ED Discharge Orders    None       Kerrie [place], [place] 07/12/17 2242    Derwood Kaplan, MD 07/14/17 1535

## 2017-07-12 NOTE — ED Triage Notes (Signed)
Pt stated her nose started to bleed 2 hours ago. This is the worse one she has had. Pt went to urgent care but they told her they were unable to reach the bleed. Pt also states she has had fevers and htn. MD started on tamiful even though flu was negative. Pt took tylenol earlier.

## 2021-05-05 LAB — HM MAMMOGRAPHY

## 2022-01-20 NOTE — Progress Notes (Unsigned)
[facility]  PATIENT: Brooke Ballard DOB: 07-09-1970  REFERRING DOCTOR OR PCP: Willeen Niece, PA (PCP); [facility] neurology) SOURCE: Patient,  _________________________________   HISTORICAL  CHIEF COMPLAINT:  No chief complaint on file.   HISTORY OF PRESENT ILLNESS:  I had the pleasure seeing the patient, Brooke Ballard, at the [place] at [facility] for neurologic consultation regarding her recent diagnosis of.  She is a 51 year old woman who had the onset of right-sided weakness in November 2022.  The onset was gradual.  She was referred to Dr. Shela Leff [facility] neurology) who noted right-sided motor symptoms and dysmetria.  MRI of the brain was ordered.  The MRI showed multiple T2/FLAIR hyperintense foci including one of the left middle cerebellar peduncle.  Several of the periventricular foci enhanced after contrast.    Vascular risks: Hypercholesterolemia, benign essential hypertension  Imaging: MRI of the cervical and thoracic spine 01/06/2022 showed a normal spinal cord.  There was mild degenerative change at C4-C5.  A focus was seen in the pons that is nonspecific.  The sella turcica is mildly enlarged.  By report, MRI of the brain 11/30/2021 showed multiple T2/FLAIR hyperintense lesions in the periventricular/subcortical white matter, corpus callosum, left middle cerebellar peduncle.  Periventricular foci were oriented perpendicular to the ventricle.  Some of the periventricular foci demonstrated abnormal enhancement consistent with acute demyelination.  Labs: CSF 12/14/2021 showed 7 oligoclonal bands in the CSF not present in the serum.  The IgG index was elevated at 1.7  Labs 12/10/2021: Vitamin D was low at 21.  Vitamin B12 was normal.  Labs 09/25/2021: Hemoglobin A1c is prediabetic at 6.2.  Total cholesterol was elevated at 221 and LDL at 128  REVIEW OF SYSTEMS: Constitutional: No fevers, chills, sweats, or change in  appetite Eyes: No visual changes, double vision, eye pain Ear, nose and throat: No hearing loss, ear pain, nasal congestion, sore throat Cardiovascular: No chest pain, palpitations Respiratory:  No shortness of breath at rest or with exertion.   No wheezes GastrointestinaI: No nausea, vomiting, diarrhea, abdominal pain, fecal incontinence Genitourinary:  No dysuria, urinary retention or frequency.  No nocturia. Musculoskeletal:  No neck pain, back pain Integumentary: No rash, pruritus, skin lesions Neurological: as above Psychiatric: No depression at this time.  No anxiety Endocrine: No palpitations, diaphoresis, change in appetite, change in weigh or increased thirst Hematologic/Lymphatic:  No anemia, purpura, petechiae. Allergic/Immunologic: No itchy/runny eyes, nasal congestion, recent allergic reactions, rashes  ALLERGIES: Allergies  Allergen Reactions   Other Shortness Of Breath, Itching and Swelling    ALL STONE FRUITS    Contrast Media [Iodinated Contrast Media] Itching and Swelling   Morphine And Related Itching and Swelling    HOME MEDICATIONS:  Current Outpatient Medications:    BLACK CURRANT SEED OIL PO, Take 2.5 mLs by mouth daily., Disp: , Rfl:    Multiple Vitamin (MULTIVITAMIN) LIQD, Take 15 mLs by mouth daily., Disp: , Rfl:    verapamil (CALAN) 120 MG tablet, Take 1 tablet (120 mg total) by mouth 2 (two) times daily., Disp: 60 tablet, Rfl: 1  PAST MEDICAL HISTORY: Past Medical History:  Diagnosis Date   Borderline diabetes    Hypertension     PAST SURGICAL HISTORY: Past Surgical History:  Procedure Laterality Date   ABDOMINAL HYSTERECTOMY     BACK SURGERY     c section x 2      FAMILY HISTORY: Family History  Problem Relation Age of Onset   Hypertension Mother  Hypertension Father     SOCIAL HISTORY: Social History   Socioeconomic History   Marital status: Divorced    Spouse name: Not on file   Number of children: Not on file   Years of  education: Not on file   Highest education level: Not on file  Occupational History   Not on file  Tobacco Use   Smoking status: Every Day    Packs/day: 0.50    Types: Cigarettes   Smokeless tobacco: Never   Tobacco comments:    Smoking 1 ppd  Substance and Sexual Activity   Alcohol use: No    Alcohol/week: 0.0 standard drinks of alcohol    Comment: social   Drug use: No   Sexual activity: Not on file  Other Topics Concern   Not on file  Social History Narrative   Not on file   Social Determinants of Health   Financial Resource Strain: Not on file  Food Insecurity: Not on file  Transportation Needs: Not on file  Physical Activity: Not on file  Stress: Not on file  Social Connections: Not on file  Intimate Partner Violence: Not on file       PHYSICAL EXAM  There were no vitals filed for this visit.  There is no height or weight on file to calculate BMI.   General: The patient is well-developed and well-nourished and in no acute distress  HEENT:  Head is [place]/AT.  Sclera are anicteric.  Funduscopic exam shows normal optic discs and retinal vessels.  Neck: No carotid bruits are noted.  The neck is nontender.  Cardiovascular: The heart has a regular rate and rhythm with a normal S1 and S2. There were no murmurs, gallops or rubs.    Skin: Extremities are without rash or  edema.  Musculoskeletal:  Back is nontender  Neurologic Exam  Mental status: The patient is alert and oriented x 3 at the time of the examination. The patient has apparent normal recent and remote memory, with an apparently normal attention span and concentration ability.   Speech is normal.  Cranial nerves: Extraocular movements are full. Pupils are equal, round, and reactive to light and accomodation.  Visual fields are full.  Facial symmetry is present. There is good facial sensation to soft touch bilaterally.Facial strength is normal.  Trapezius and sternocleidomastoid strength is normal. No  dysarthria is noted.  The tongue is midline, and the patient has symmetric elevation of the soft palate. No obvious hearing deficits are noted.  Motor:  Muscle bulk is normal.   Tone is normal. Strength is  5 / 5 in all 4 extremities.   Sensory: Sensory testing is intact to pinprick, soft touch and vibration sensation in all 4 extremities.  Coordination: Cerebellar testing reveals good finger-nose-finger and heel-to-shin bilaterally.  Gait and station: Station is normal.   Gait is normal. Tandem gait is normal. Romberg is negative.   Reflexes: Deep tendon reflexes are symmetric and normal bilaterally.   Plantar responses are flexor.    DIAGNOSTIC DATA (LABS, IMAGING, TESTING) - I reviewed patient records, labs, notes, testing and imaging myself where available.  Lab Results  Component Value Date   WBC 13.6 (H) 02/02/2015   HGB 14.5 02/02/2015   HCT 42.6 02/02/2015   MCV 91.0 02/02/2015   PLT 294 02/02/2015      Component Value Date/Time   NA 139 02/02/2015 2053   K 3.6 02/02/2015 2053   CL 107 02/02/2015 2053   CO2 25 02/02/2015 2053  GLUCOSE 96 02/02/2015 2053   BUN 15 02/02/2015 2053   CREATININE 0.86 02/02/2015 2053   CREATININE 0.76 07/18/2014 1025   CALCIUM 9.4 02/02/2015 2053   PROT 6.2 07/18/2014 1025   ALBUMIN 3.7 07/18/2014 1025   AST 16 07/18/2014 1025   ALT 23 07/18/2014 1025   ALKPHOS 47 07/18/2014 1025   BILITOT 0.4 07/18/2014 1025   GFRNONAA >60 02/02/2015 2053   GFRNONAA >89 07/18/2014 1025   GFRAA >60 02/02/2015 2053   GFRAA >89 07/18/2014 1025   Lab Results  Component Value Date   CHOL 178 07/18/2014   HDL 32 (L) 07/18/2014   LDLCALC 124 (H) 07/18/2014   TRIG 108 07/18/2014   CHOLHDL 5.6 07/18/2014   Lab Results  Component Value Date   HGBA1C 6.1 (H) 07/18/2014   No results found for: "VITAMINB12" No results found for: "TSH"     ASSESSMENT AND PLAN  ***   Keeshawn Fakhouri A. Felecia Shelling, MD, [facility] 0000000, [id number] PM Certified in Neurology,  Clinical Neurophysiology, Sleep Medicine and Neuroimaging  [facility] [address], [place], [place] [address] [phone]

## 2022-01-21 ENCOUNTER — Telehealth: Payer: Self-pay | Admitting: Neurology

## 2022-01-21 ENCOUNTER — Encounter: Payer: Self-pay | Admitting: Neurology

## 2022-01-21 ENCOUNTER — Ambulatory Visit: Payer: BC Managed Care – PPO | Admitting: Neurology

## 2022-01-21 VITALS — BP 145/96 | HR 64 | Ht 68.0 in | Wt 305.2 lb

## 2022-01-21 DIAGNOSIS — R2689 Other abnormalities of gait and mobility: Secondary | ICD-10-CM

## 2022-01-21 DIAGNOSIS — Z79899 Other long term (current) drug therapy: Secondary | ICD-10-CM

## 2022-01-21 DIAGNOSIS — G4719 Other hypersomnia: Secondary | ICD-10-CM

## 2022-01-21 DIAGNOSIS — G35 Multiple sclerosis: Secondary | ICD-10-CM | POA: Diagnosis not present

## 2022-01-21 DIAGNOSIS — R29898 Other symptoms and signs involving the musculoskeletal system: Secondary | ICD-10-CM

## 2022-01-21 DIAGNOSIS — R0683 Snoring: Secondary | ICD-10-CM

## 2022-01-21 DIAGNOSIS — R269 Unspecified abnormalities of gait and mobility: Secondary | ICD-10-CM

## 2022-01-21 MED ORDER — DIMETHYL FUMARATE 240 MG PO CPDR: DELAYED_RELEASE_CAPSULE | ORAL | 3 refills | Status: DC

## 2022-01-21 NOTE — Telephone Encounter (Signed)
Placed [facility] lab in [facility] lock box for routine lab pick up. Results pending. 

## 2022-01-22 LAB — QUANTIFERON-TB GOLD PLUS

## 2022-01-23 LAB — IGG, IGA, IGM: IgA/Immunoglobulin A, Serum: 248 mg/dL (ref 87–352)

## 2022-01-23 LAB — HEPATITIS B SURFACE ANTIBODY,QUALITATIVE: Hep B Surface Ab, Qual: NONREACTIVE

## 2022-01-23 LAB — QUANTIFERON-TB GOLD PLUS

## 2022-01-26 LAB — QUANTIFERON-TB GOLD PLUS
QuantiFERON Nil Value: 0.1 IU/mL
QuantiFERON TB2 Ag Value: 0.14 IU/mL
QuantiFERON-TB Gold Plus: NEGATIVE

## 2022-01-26 LAB — IGG, IGA, IGM
IgG (Immunoglobin G), Serum: 1105 mg/dL (ref 586–1602)
IgM (Immunoglobulin M), Srm: 30 mg/dL (ref 26–217)

## 2022-01-26 LAB — HEPATITIS C ANTIBODY: Hep C Virus Ab: NONREACTIVE

## 2022-01-26 LAB — HEPATITIS B CORE ANTIBODY, TOTAL: Hep B Core Total Ab: NEGATIVE

## 2022-01-26 LAB — HEPATITIS B SURFACE ANTIGEN: Hepatitis B Surface Ag: NEGATIVE

## 2022-01-26 LAB — HIV ANTIBODY (ROUTINE TESTING W REFLEX): HIV Screen 4th Generation wRfx: NONREACTIVE

## 2022-01-26 LAB — VARICELLA ZOSTER ANTIBODY, IGG: Varicella zoster IgG: 1084 index (ref >165)

## 2022-01-27 ENCOUNTER — Encounter: Payer: Self-pay | Admitting: *Deleted

## 2022-01-27 ENCOUNTER — Telehealth: Payer: Self-pay | Admitting: Neurology

## 2022-01-27 NOTE — Telephone Encounter (Signed)
HST- [employer] state no auth req.  Patient is scheduled at [facility] for 03/02/22 at 9 AM.  Mailed packet to the patient.

## 2022-02-01 NOTE — Telephone Encounter (Signed)
[facility] ab drawn on 01/21/22 positive, index: 0.87.

## 2022-02-03 ENCOUNTER — Telehealth: Payer: Self-pay

## 2022-02-03 NOTE — Telephone Encounter (Signed)
Faxed [employer] start form to [employer] patient support.  Received a receipt of confirmation.  Patient currently has [place] but will be changing on February 24, 2022 to [facility].  I gave the [employer] order to the infusion suite to try and get her started prior to her new insurance.  The infusion suite will keep me updated if it is better to wait until her new insurance is activated.

## 2022-02-08 NOTE — Telephone Encounter (Signed)
Received notice that patient has been approved for the [employer] PAP until 04/25/22 or until the patient no longer qualifies. Gave this information to Maudie Mercury, Therapist, sports in infusion suite.

## 2022-02-25 ENCOUNTER — Ambulatory Visit: Payer: 59 | Admitting: Physical Therapy

## 2022-03-02 ENCOUNTER — Encounter: Payer: Self-pay | Admitting: Physical Therapy

## 2022-03-02 ENCOUNTER — Ambulatory Visit: Payer: 59 | Attending: Neurology | Admitting: Physical Therapy

## 2022-03-02 ENCOUNTER — Ambulatory Visit: Payer: BC Managed Care – PPO | Admitting: Neurology

## 2022-03-02 DIAGNOSIS — G35 Multiple sclerosis: Secondary | ICD-10-CM | POA: Diagnosis not present

## 2022-03-02 DIAGNOSIS — R2689 Other abnormalities of gait and mobility: Secondary | ICD-10-CM

## 2022-03-02 DIAGNOSIS — R2681 Unsteadiness on feet: Secondary | ICD-10-CM

## 2022-03-02 DIAGNOSIS — G4719 Other hypersomnia: Secondary | ICD-10-CM

## 2022-03-02 DIAGNOSIS — R29898 Other symptoms and signs involving the musculoskeletal system: Secondary | ICD-10-CM | POA: Diagnosis not present

## 2022-03-02 DIAGNOSIS — R0683 Snoring: Secondary | ICD-10-CM

## 2022-03-02 DIAGNOSIS — R269 Unspecified abnormalities of gait and mobility: Secondary | ICD-10-CM | POA: Diagnosis not present

## 2022-03-02 DIAGNOSIS — M6281 Muscle weakness (generalized): Secondary | ICD-10-CM | POA: Diagnosis present

## 2022-03-02 NOTE — Therapy (Signed)
OUTPATIENT PHYSICAL THERAPY NEURO EVALUATION   Patient Name: Brooke Ballard MRN: [id number] DOB:[birth year], 51 y.o., female Today's Date: 03/03/2022   PCP: Maud Deed, PA REFERRING PROVIDER: Asa Lente, MD   PT End of Session - 03/03/22 1834     Visit Number 1    Number of Visits 13    Date for PT Re-Evaluation 04/16/22    Authorization Type [employer]    PT Start Time 1020    PT Stop Time 1100    PT Time Calculation (min) 40 min    Activity Tolerance Patient tolerated treatment well    Behavior During Therapy WFL for tasks assessed/performed             Past Medical History:  Diagnosis Date   Borderline diabetes    Hypertension    Multiple sclerosis (HCC)    Past Surgical History:  Procedure Laterality Date   ABDOMINAL HYSTERECTOMY     BACK SURGERY     c section x 2     Patient Active Problem List   Diagnosis Date Noted   Cluster headache 12/10/2014   Tobacco use disorder 07/18/2014   HTN (hypertension) 03/20/2014    ONSET DATE: August 2023  REFERRING DIAG:  Diagnosis  G35 (ICD-10-CM) - Multiple sclerosis (HCC)  R26.9 (ICD-10-CM) - Gait disturbance  R29.898 (ICD-10-CM) - Right leg weakness  R26.89 (ICD-10-CM) - Balance disorder    THERAPY DIAG:  Other abnormalities of gait and mobility  Muscle weakness (generalized)  Unsteadiness on feet  Rationale for Evaluation and Treatment: Rehabilitation  SUBJECTIVE:                                                                                                                                                                                             SUBJECTIVE STATEMENT: Pt reports she started having Rt sided weakness in Dec. 2022; had lumbar fusion in 2008 and thought that the weakness was coming from this surgery; was diagnosed with MS in August 2023; reports she gets blurred vision with fatigue Pt accompanied by: self  PERTINENT HISTORY: Borderline DM, HTN, h/o back surgery approx. 10 yrs  ago  PAIN:  Are you having pain? No  PRECAUTIONS: None  WEIGHT BEARING RESTRICTIONS: No  FALLS: Has patient fallen in last 6 months? No  LIVING ENVIRONMENT: Lives with: lives with their family Lives in: Other townhouse Stairs: Yes: Internal: 12 steps; bilateral but cannot reach both Has following equipment at home: Single point cane has rubber quad tip   PLOF: Independent  PATIENT GOALS: increase strength of Rt side and balance: be able to walk steady for longer time periods  OBJECTIVE:    COGNITION: Overall cognitive status: Within functional limits for tasks assessed   SENSATION: Rt toes feel like it has a string tied around it   COORDINATION:   WNL's bil. LE's  POSTURE: No Significant postural limitations  LOWER EXTREMITY ROM:   WNL's bil. LE's  LOWER EXTREMITY MMT:    MMT Right Eval Left Eval  Hip flexion 4   Hip extension 3+   Hip abduction 3+   Hip adduction    Hip internal rotation    Hip external rotation    Knee flexion 4   Knee extension 5   Ankle dorsiflexion 4+   Ankle plantarflexion 4-   Ankle inversion    Ankle eversion    (Blank rows = not tested)  BED MOBILITY:  Independent  TRANSFERS: Assistive device utilized: None  Sit to stand: Modified independence Stand to sit: Modified independence  RAMP:  TBA  CURB: TBA  STAIRS: Level of Assistance: Modified independence Stair Negotiation Technique: Alternating Pattern  with Bilateral Rails Number of Stairs: 4  Height of Stairs: 6  Comments: performance varies depending on fatigue  GAIT: Gait pattern: step through pattern, decreased ankle dorsiflexion- Right, and lateral lean- Right Distance walked: 5'  Assistive device utilized:  hurry cane Level of assistance: Modified independence Comments: gait deviations increase with fatigue due to decreased muscle endurance  FUNCTIONAL TESTS:  5 times sit to stand: 14.72 secs LLE SLS = 6.88 secs:  RLE SLS = 1.79 secs   PATIENT  SURVEYS:  N/A   PATIENT EDUCATION: Education details: eval results; discussed aquatic therapy Person educated: Patient Education method: Explanation Education comprehension: verbalized understanding  HOME EXERCISE PROGRAM: To be established   GOALS: Goals reviewed with patient? Yes  SHORT TERM GOALS: Target date: 04/02/2022  Initiate aquatic therapy for balance, strengthening and endurance training. Baseline: Goal status: INITIAL  2.  Pt will amb. 350' without SPC on flat, even surface.  Baseline:  Goal status: INITIAL  3.  Improve 5 times sit to stand score to </=12 secs to demo improved RLE strength. Baseline:  Goal status: INITIAL  4.  Pt will report ability to amb. 10" with use of SPC prn without needing seated rest period to demo improved endurance.  Baseline:  Goal status: INITIAL  5.  Independent in HEP for RLE strenghtening and balance exercises.  Baseline:  Goal status: INITIAL   LONG TERM GOALS: Target date: 04/28/2022  Pt will report ability to amb. 15" nonstop with SPC prn to demo improved endurance.  Baseline:  Goal status: INITIAL  2.  Improve 5 times sit to stand score to </= 10.5 secs to demo improved LE strength.  Baseline: 14.72 secs without UE support from chair Goal status: INITIAL  3.  Improve balance so pt able to perform SLS on RLE >/= 4 secs to increase safety with stepping over objects.  Baseline: RLE 1.79 secs; LLE 6.88 secs Goal status: INITIAL  4.  Independent in HEP for LE strengthening and stretching and also for aquatic exercise.  Baseline:  Goal status: INITIAL  5.  Pt will negotiate 4 steps without hand raill using step by step sequence for safety. Baseline:  Goal status: INITIAL  ASSESSMENT:  CLINICAL IMPRESSION: Patient is a 51 y.o. lady who was seen today for physical therapy evaluation and treatment for RLE weakness, gait and balance impairment due to MS.  Pt presents with weakness in Rt hip abductors and extensors and  also in Rt plantarflexors.  Pt using  SPC at eval but states performance varies and says she is able to walk without cane at times.  Pt will benefit from PT to address RLE weakness, gait and high level balance deficits, specifically decreased RLE SLS.    OBJECTIVE IMPAIRMENTS: decreased activity tolerance, decreased balance, decreased endurance, difficulty walking, decreased strength, and impaired tone.   ACTIVITY LIMITATIONS: lifting, squatting, stairs, and locomotion level  PARTICIPATION LIMITATIONS: meal prep, cleaning, shopping, and community activity  PERSONAL FACTORS: 1 comorbidity: MS   are also affecting patient's functional outcome.   REHAB POTENTIAL: Good  CLINICAL DECISION MAKING: Stable/uncomplicated  EVALUATION COMPLEXITY: Low  PLAN:  PT FREQUENCY: 1x/week  PT DURATION: 8 weeks  PLANNED INTERVENTIONS: Therapeutic exercises, Therapeutic activity, Neuromuscular re-education, Balance training, Gait training, Patient/Family education, Self Care, Stair training, and Aquatic Therapy  PLAN FOR NEXT SESSION: issue HEP - RLE strengthening and stretching; bridging   Alda Lea, PT 03/03/2022, 6:36 PM

## 2022-03-03 ENCOUNTER — Encounter: Payer: Self-pay | Admitting: Physical Therapy

## 2022-03-08 NOTE — Telephone Encounter (Signed)
Repeat from 03/02/22 due to device not working HST- [employer] no auth req.  She is r/s for 03/09/22 at 1:45 pm.

## 2022-03-09 ENCOUNTER — Ambulatory Visit: Payer: 59 | Admitting: Physical Therapy

## 2022-03-09 ENCOUNTER — Ambulatory Visit: Payer: 59

## 2022-03-09 DIAGNOSIS — R2689 Other abnormalities of gait and mobility: Secondary | ICD-10-CM

## 2022-03-09 DIAGNOSIS — M6281 Muscle weakness (generalized): Secondary | ICD-10-CM

## 2022-03-09 NOTE — Therapy (Unsigned)
OUTPATIENT PHYSICAL THERAPY NEURO EVALUATION   Patient Name: Brooke Ballard MRN: [id number] DOB:1970/10/10, 51 y.o., female Today's Date: 03/10/2022   PCP: Maud Deed, PA REFERRING PROVIDER: Asa Lente, MD   PT End of Session - 03/10/22 1250     Visit Number 2    Number of Visits 13    Date for PT Re-Evaluation 04/16/22    Authorization Type [employer]    PT Start Time 1405    PT Stop Time 1446    PT Time Calculation (min) 41 min    Activity Tolerance Patient limited by fatigue    Behavior During Therapy [facility] for tasks assessed/performed              Past Medical History:  Diagnosis Date   Borderline diabetes    Hypertension    Multiple sclerosis (HCC)    Past Surgical History:  Procedure Laterality Date   ABDOMINAL HYSTERECTOMY     BACK SURGERY     c section x 2     Patient Active Problem List   Diagnosis Date Noted   Cluster headache 12/10/2014   Tobacco use disorder 07/18/2014   HTN (hypertension) 03/20/2014    ONSET DATE: August 2023  REFERRING DIAG:  Diagnosis  G35 (ICD-10-CM) - Multiple sclerosis (HCC)  R26.9 (ICD-10-CM) - Gait disturbance  R29.898 (ICD-10-CM) - Right leg weakness  R26.89 (ICD-10-CM) - Balance disorder    THERAPY DIAG:  Other abnormalities of gait and mobility  Muscle weakness (generalized)  Rationale for Evaluation and Treatment: Rehabilitation  SUBJECTIVE:                                                                                                                                                                                             SUBJECTIVE STATEMENT: Pt reports she started having Rt sided weakness in Dec. 2022; had lumbar fusion in 2008 and thought that the weakness was coming from this surgery; was diagnosed with MS in August 2023; reports she gets blurred vision with fatigue Pt accompanied by: self  PERTINENT HISTORY: Borderline DM, HTN, h/o back surgery approx. 10 yrs ago  PAIN:  Are you having pain?  No  PRECAUTIONS: None  WEIGHT BEARING RESTRICTIONS: No  FALLS: Has patient fallen in last 6 months? No  LIVING ENVIRONMENT: Lives with: lives with their family Lives in: Other townhouse Stairs: Yes: Internal: 12 steps; bilateral but cannot reach both Has following equipment at home: Single point cane has rubber quad tip   PLOF: Independent  PATIENT GOALS: increase strength of Rt side and balance: be able to walk steady for longer time periods  OBJECTIVE:  GAIT: Gait pattern: step through pattern, decreased ankle dorsiflexion- Right, and lateral lean- Right Distance walked: 63'  Assistive device utilized:  hurry cane Level of assistance: Modified independence Comments: gait deviations increase with fatigue due to decreased muscle endurance   THEREX:  Hamstring stretch (Runner's stretch with foot on 2nd step) 20 sec hold x 1 rep each leg Seated hamstring stretch with foot on floor, knee extended 20 sec hold x 1 rep RLE Gastroc stretch with heels off edge of step 20 sec hold x 1 rep each leg  Bil. LE's heel raise 10 reps;  RLE only 10 reps with bil. UE support on rails  Seated knee flexion with red theraband 10 reps, 3 sec hold RLE  Standing hip exercises with red theraband - hip abduction, flexion and extension 10 reps each RLE; pt performed hip abduction with LLE with red theraband for RLE isometric strengthening  Bridging 5 reps; bridging with hip abduction/adduction 5 reps; bridging with marching 5 reps, bridging with LLE extension  Access Code: [email or web address] URL: [email or web address] Date: 03/10/2022 Prepared by: Maebelle Munroe  Exercises - Supine Bridge  - 1 x daily - 7 x weekly - 3 sets - 10 reps - Supine March  - 1 x daily - 7 x weekly - 1 sets - 10 reps - Bridge with knees apart - NO RESISTANCE  - 1 x daily - 7 x weekly - 1 sets - 5 reps - Supine Bridge with Knee Extension and Pelvic Floor Contraction  - 1 x daily - 7 x weekly - 1 sets - 5 reps - Hip  Extension with Resistance Loop  - 1 x daily - 7 x weekly - 1 sets - 5-10 reps - Standing Hip Abduction with Counter Support  - 1 x daily - 7 x weekly - 1 sets - 10 reps - Seated Hamstring Curl with Anchored Resistance  - 1 x daily - 7 x weekly - 1 sets - 10 reps - Single Leg Heel Raise with Counter Support  - 1 x daily - 7 x weekly - 1 sets - 10 reps   PATIENT EDUCATION: Education details: [employer] [email or web address] Person educated: Patient Education method: Explanation Education comprehension: verbalized understanding  HOME EXERCISE PROGRAM: To be established   GOALS: Goals reviewed with patient? Yes  SHORT TERM GOALS: Target date: 04/02/2022  Initiate aquatic therapy for balance, strengthening and endurance training. Baseline: Goal status: INITIAL  2.  Pt will amb. 350' without SPC on flat, even surface.  Baseline:  Goal status: INITIAL  3.  Improve 5 times sit to stand score to </=12 secs to demo improved RLE strength. Baseline:  Goal status: INITIAL  4.  Pt will report ability to amb. 10" with use of SPC prn without needing seated rest period to demo improved endurance.  Baseline:  Goal status: INITIAL  5.  Independent in HEP for RLE strenghtening and balance exercises.  Baseline:  Goal status: INITIAL   LONG TERM GOALS: Target date: 04/28/2022  Pt will report ability to amb. 15" nonstop with SPC prn to demo improved endurance.  Baseline:  Goal status: INITIAL  2.  Improve 5 times sit to stand score to </= 10.5 secs to demo improved LE strength.  Baseline: 14.72 secs without UE support from chair Goal status: INITIAL  3.  Improve balance so pt able to perform SLS on RLE >/= 4 secs to increase safety with stepping over objects.  Baseline: RLE 1.79 secs; LLE 6.88 secs Goal status: INITIAL  4.  Independent in HEP for LE strengthening and stretching and also for aquatic exercise.  Baseline:  Goal status: INITIAL  5.  Pt will negotiate 4 steps without hand raill using  step by step sequence for safety. Baseline:  Goal status: INITIAL  ASSESSMENT:  CLINICAL IMPRESSION: PT session focused on establishing HEP for Rt hamstring and gastroc stretching and RLE strengthening.  Pt was very fatigued in today's session due to having had infusion just prior to scheduled PT appt.  Pt had 1 occurrence of instability when amb. From counter back to high low mat table, approx. 15', but was able to recover independently with UE support on mat.  Cont with POC.     OBJECTIVE IMPAIRMENTS: decreased activity tolerance, decreased balance, decreased endurance, difficulty walking, decreased strength, and impaired tone.   ACTIVITY LIMITATIONS: lifting, squatting, stairs, and locomotion level  PARTICIPATION LIMITATIONS: meal prep, cleaning, shopping, and community activity  PERSONAL FACTORS: 1 comorbidity: MS   are also affecting patient's functional outcome.   REHAB POTENTIAL: Good  CLINICAL DECISION MAKING: Stable/uncomplicated  EVALUATION COMPLEXITY: Low  PLAN:  PT FREQUENCY: 1x/week  PT DURATION: 8 weeks  PLANNED INTERVENTIONS: Therapeutic exercises, Therapeutic activity, Neuromuscular re-education, Balance training, Gait training, Patient/Family education, Self Care, Stair training, and Aquatic Therapy  PLAN FOR NEXT SESSION:  check/review HEP   Loranzo Desha, Donavan Burnet, PT 03/10/2022, 12:52 PM

## 2022-03-10 ENCOUNTER — Encounter: Payer: Self-pay | Admitting: Neurology

## 2022-03-10 ENCOUNTER — Encounter: Payer: Self-pay | Admitting: Physical Therapy

## 2022-03-11 ENCOUNTER — Encounter: Payer: Self-pay | Admitting: *Deleted

## 2022-03-11 NOTE — Telephone Encounter (Signed)
Patient r/s for 03/30/22 at 1:45 pm.

## 2022-03-16 ENCOUNTER — Ambulatory Visit: Payer: 59 | Admitting: Physical Therapy

## 2022-03-16 DIAGNOSIS — R2689 Other abnormalities of gait and mobility: Secondary | ICD-10-CM | POA: Diagnosis not present

## 2022-03-16 DIAGNOSIS — M6281 Muscle weakness (generalized): Secondary | ICD-10-CM

## 2022-03-16 NOTE — Therapy (Signed)
OUTPATIENT PHYSICAL THERAPY NEURO TREATMENT NOTE   Patient Name: Brooke Ballard MRN: [id number] DOB:08-14-1970, 51 y.o., female Today's Date: 03/17/2022   PCP: Maud Deed, PA REFERRING PROVIDER: Asa Lente, MD   PT End of Session - 03/17/22 1921     Visit Number 3    Number of Visits 13    Date for PT Re-Evaluation 04/16/22    Authorization Type [employer]    PT Start Time 1403    PT Stop Time 1446    PT Time Calculation (min) 43 min    Activity Tolerance Patient tolerated treatment well    Behavior During Therapy [facility] for tasks assessed/performed               Past Medical History:  Diagnosis Date   Borderline diabetes    Hypertension    Multiple sclerosis (HCC)    Past Surgical History:  Procedure Laterality Date   ABDOMINAL HYSTERECTOMY     BACK SURGERY     c section x 2     Patient Active Problem List   Diagnosis Date Noted   Cluster headache 12/10/2014   Tobacco use disorder 07/18/2014   HTN (hypertension) 03/20/2014    ONSET DATE: August 2023  REFERRING DIAG:  Diagnosis  G35 (ICD-10-CM) - Multiple sclerosis (HCC)  R26.9 (ICD-10-CM) - Gait disturbance  R29.898 (ICD-10-CM) - Right leg weakness  R26.89 (ICD-10-CM) - Balance disorder    THERAPY DIAG:  Muscle weakness (generalized)  Other abnormalities of gait and mobility  Rationale for Evaluation and Treatment: Rehabilitation  SUBJECTIVE:                                                                                                                                                                                             SUBJECTIVE STATEMENT: Pt reports she started having Rt sided weakness in Dec. 2022; had lumbar fusion in 2008 and thought that the weakness was coming from this surgery; was diagnosed with MS in August 2023; reports she gets blurred vision with fatigue Pt accompanied by: self  PERTINENT HISTORY: Borderline DM, HTN, h/o back surgery approx. 10 yrs ago  PAIN:  Are you  having pain? No  PRECAUTIONS: None  WEIGHT BEARING RESTRICTIONS: No  FALLS: Has patient fallen in last 6 months? No  LIVING ENVIRONMENT: Lives with: lives with their family Lives in: Other townhouse Stairs: Yes: Internal: 12 steps; bilateral but cannot reach both Has following equipment at home: Single point cane has rubber quad tip   PLOF: Independent  PATIENT GOALS: increase strength of Rt side and balance: be able to walk steady for longer time periods  OBJECTIVE:  03-16-22:  GAIT: Gait pattern: step through pattern, decreased ankle dorsiflexion- Right, and lateral lean- Right Distance walked: 37'  Assistive device utilized:  hurry cane Level of assistance: Modified independence Comments: gait deviations increase with fatigue due to decreased muscle endurance; Rt knee instability noted - Rt knee hyperextension   THEREX:  Hamstring stretch (Runner's stretch with foot on 2nd step) 20 sec hold x 1 rep each leg Seated hamstring stretch with foot on floor, knee extended 20 sec hold x 1 rep RLE Gastroc stretch with heels off edge of step 20 sec hold x 1 rep each leg  Bil. LE's heel raise 10 reps;  RLE only attempted but pt unable to plantarflex unilaterally; modified exercise to bil. LE's with increase weight shift onto RLE in standing - pt performed 10 reps with bil. UE support  Seated knee flexion with green theraband 10 reps x 2 sets;  3 sec hold RLE    Rt knee flexion 2# prone 2 sets 10 reps  Hip extension prone with knee flexed no weight used  NEURORE-ED; Quadruped position attempted on mat - lifting opposite UE/LE 2 reps each side; pt c/o knee discomfort  so towels were used under each knee for increased comfort but pt cont. To c/o discomfort due to pressure on knees so this exercise was stopped  Rt knee extension control exercise with green theraband - bil. Stance, forward and backward stance LLE - 15 reps each position  Access Code: [email or web address] URL:  [email or web address] Date: 03/17/2022 Prepared by: Maebelle Munroe  Exercises - Seated Hamstring Curl with Anchored Resistance  - 1 x daily - 7 x weekly - 1 sets - 10 reps - 5 sec hold - Prone Hamstring Curl with Anchored Resistance  - 1 x daily - 7 x weekly - 1 sets - 10 reps - 5 sec hold - Prone Hip Extension with Bent Knee  - 1 x daily - 7 x weekly - 1 sets - 10 reps  Access Code: [email or web address] URL: [email or web address] Date: 03/10/2022 Prepared by: Maebelle Munroe  Exercises - Supine Bridge  - 1 x daily - 7 x weekly - 3 sets - 10 reps - Supine March  - 1 x daily - 7 x weekly - 1 sets - 10 reps - Bridge with knees apart - NO RESISTANCE  - 1 x daily - 7 x weekly - 1 sets - 5 reps - Supine Bridge with Knee Extension and Pelvic Floor Contraction  - 1 x daily - 7 x weekly - 1 sets - 5 reps - Hip Extension with Resistance Loop  - 1 x daily - 7 x weekly - 1 sets - 5-10 reps - Standing Hip Abduction with Counter Support  - 1 x daily - 7 x weekly - 1 sets - 10 reps - Seated Hamstring Curl with Anchored Resistance  - 1 x daily - 7 x weekly - 1 sets - 10 reps - Single Leg Heel Raise with Counter Support  - 1 x daily - 7 x weekly - 1 sets - 10 reps   PATIENT EDUCATION: Education details: [employer] [email or web address];   03-16-22 added Medbridge exs. [email or web address] Person educated: Patient Education method: Explanation Education comprehension: verbalized understanding  HOME EXERCISE PROGRAM: To be established   GOALS: Goals reviewed with patient? Yes  SHORT TERM GOALS: Target date: 04/02/2022  Initiate aquatic therapy for balance, strengthening and endurance training. Baseline: Goal status: INITIAL  2.  Pt will amb. 350' without SPC on flat, even surface.  Baseline:  Goal status: INITIAL  3.  Improve 5 times sit to stand score to </=12 secs to demo improved RLE strength. Baseline:  Goal status: INITIAL  4.  Pt will report ability to amb. 10" with use of SPC prn without  needing seated rest period to demo improved endurance.  Baseline:  Goal status: INITIAL  5.  Independent in HEP for RLE strenghtening and balance exercises.  Baseline:  Goal status: INITIAL   LONG TERM GOALS: Target date: 04/28/2022  Pt will report ability to amb. 15" nonstop with SPC prn to demo improved endurance.  Baseline:  Goal status: INITIAL  2.  Improve 5 times sit to stand score to </= 10.5 secs to demo improved LE strength.  Baseline: 14.72 secs without UE support from chair Goal status: INITIAL  3.  Improve balance so pt able to perform SLS on RLE >/= 4 secs to increase safety with stepping over objects.  Baseline: RLE 1.79 secs; LLE 6.88 secs Goal status: INITIAL  4.  Independent in HEP for LE strengthening and stretching and also for aquatic exercise.  Baseline:  Goal status: INITIAL  5.  Pt will negotiate 4 steps without hand raill using step by step sequence for safety. Baseline:  Goal status: INITIAL  ASSESSMENT:  CLINICAL IMPRESSION: PT session focused on RLE strengthening with focus on Rt hamstrings and gastrocs to address intermittent occurrences of Rt knee genu recurvatum.  Pt unable to plantarflex RLE unilaterally in stance due to gastroc weakness. Pt unable to tolerate exercises in quadruped position due to c/o knee discomfort due to pressure on joints in this position.   Cont with POC.     OBJECTIVE IMPAIRMENTS: decreased activity tolerance, decreased balance, decreased endurance, difficulty walking, decreased strength, and impaired tone.   ACTIVITY LIMITATIONS: lifting, squatting, stairs, and locomotion level  PARTICIPATION LIMITATIONS: meal prep, cleaning, shopping, and community activity  PERSONAL FACTORS: 1 comorbidity: MS   are also affecting patient's functional outcome.   REHAB POTENTIAL: Good  CLINICAL DECISION MAKING: Stable/uncomplicated  EVALUATION COMPLEXITY: Low  PLAN:  PT FREQUENCY: 1x/week  PT DURATION: 8 weeks  PLANNED  INTERVENTIONS: Therapeutic exercises, Therapeutic activity, Neuromuscular re-education, Balance training, Gait training, Patient/Family education, Self Care, Stair training, and Aquatic Therapy  PLAN FOR NEXT SESSION:  check/review HEP   Kary Kos, PT 03/17/2022, 7:38 PM

## 2022-03-17 ENCOUNTER — Encounter: Payer: Self-pay | Admitting: Physical Therapy

## 2022-03-23 ENCOUNTER — Ambulatory Visit: Payer: 59 | Admitting: Physical Therapy

## 2022-03-30 ENCOUNTER — Ambulatory Visit: Payer: 59 | Admitting: Neurology

## 2022-03-30 ENCOUNTER — Ambulatory Visit: Payer: 59 | Attending: Neurology | Admitting: Physical Therapy

## 2022-03-30 DIAGNOSIS — R2681 Unsteadiness on feet: Secondary | ICD-10-CM | POA: Diagnosis present

## 2022-03-30 DIAGNOSIS — R0683 Snoring: Secondary | ICD-10-CM

## 2022-03-30 DIAGNOSIS — G4719 Other hypersomnia: Secondary | ICD-10-CM

## 2022-03-30 DIAGNOSIS — M6281 Muscle weakness (generalized): Secondary | ICD-10-CM | POA: Diagnosis present

## 2022-03-30 DIAGNOSIS — G4733 Obstructive sleep apnea (adult) (pediatric): Secondary | ICD-10-CM | POA: Diagnosis not present

## 2022-03-30 DIAGNOSIS — R2689 Other abnormalities of gait and mobility: Secondary | ICD-10-CM | POA: Insufficient documentation

## 2022-03-30 NOTE — Patient Instructions (Signed)
   Aquatic Therapy: What to Expect!  Where:  [facility] [address] [place], [place] [address] [phone]           How to Prepare: Please make sure you drink 8 ounces of water about one hour prior to your pool session A caregiver must attend the entire session with the patient (unless your primary therapists feels this is not necessary). The caregiver will be responsible for assisting with dressing as well as any toileting needs.  Please arrive IN YOUR SUIT and a few minutes prior to your appointment - this helps to avoid delays in starting your session. Please make sure to attend to any toileting needs prior to entering the pool Once on the pool deck your therapist will ask you to sign the Patient  Consent and Assignment of Benefits form Your therapist may take your blood pressure prior to, during and after your session if indicated We usually try and create a home exercise program based on activities we do in the pool.  Please be thinking about who might be able to assist you in the pool should you want to participate in an aquatic home exercise program at the time of discharge.  Some patients do not want to or do not have the ability to participate in an aquatic home program - this is not a barrier in any way to you participating in aquatic therapy as part of your current therapy plan!    About the pool: Entering the pool Your therapist will assist you; there are multiple ways to enter including stairs with railings, a walk in ramp, a roll in chair and a mechanical lift. Your therapist will determine the most appropriate way for you. Water temperature is usually between 86-87 degrees There may be other swimmers in the pool at the same time     Contact Info:             Appointments: [facility]         All sessions are 45 minutes   [address]  [address]            Please call the [facility]  if   [place], [place]  [address]           you need to cancel or reschedule an appointment.  [phone]           Suzanne Jazira Maloney, PT    Karen Pulaski, OTR/L    10/17/19  

## 2022-03-30 NOTE — Therapy (Signed)
OUTPATIENT PHYSICAL THERAPY NEURO TREATMENT NOTE   Patient Name: Brooke Ballard MRN: [id number] DOB:[birth year], 51 y.o., female Today's Date: 03/31/2022   PCP: Willeen Niece, PA REFERRING PROVIDER: Britt Bottom, MD   PT End of Session - 03/31/22 1206     Visit Number 4    Number of Visits 13    Date for PT Re-Evaluation 04/16/22    Authorization Type U[employer]    PT Start Time 9038    PT Stop Time 1450    PT Time Calculation (min) 45 min    Activity Tolerance Patient tolerated treatment well    Behavior During Therapy WFL for tasks assessed/performed                Past Medical History:  Diagnosis Date   Borderline diabetes    Hypertension    Multiple sclerosis ([place])    Past Surgical History:  Procedure Laterality Date   ABDOMINAL HYSTERECTOMY     BACK SURGERY     c section x 2     Patient Active Problem List   Diagnosis Date Noted   Cluster headache 12/10/2014   Tobacco use disorder 07/18/2014   HTN (hypertension) 03/20/2014    ONSET DATE: August 2023  REFERRING DIAG:  Diagnosis  G35 (ICD-10-CM) - Multiple sclerosis ([place])  R26.9 (ICD-10-CM) - Gait disturbance  R29.898 (ICD-10-CM) - Right leg weakness  R26.89 (ICD-10-CM) - Balance disorder    THERAPY DIAG:  Muscle weakness (generalized)  Other abnormalities of gait and mobility  Unsteadiness on feet  Rationale for Evaluation and Treatment: Rehabilitation  SUBJECTIVE:                                                                                                                                                                                             SUBJECTIVE STATEMENT: Pt reports she has been having more pins and needles sensation and also states her vision has been blurry lately; states Sunday was a "bad day" - had to sit down a lot when she was cooking Pt accompanied by: self  PERTINENT HISTORY: Borderline DM, HTN, h/o back surgery approx. 10 yrs ago  PAIN:  Are you having pain?  No  PRECAUTIONS: None  WEIGHT BEARING RESTRICTIONS: No  FALLS: Has patient fallen in last 6 months? No  LIVING ENVIRONMENT: Lives with: lives with their family Lives in: Other townhouse Stairs: Yes: Internal: 12 steps; bilateral but cannot reach both Has following equipment at home: Single point cane has rubber quad tip   PLOF: Independent  PATIENT GOALS: increase strength of Rt side and balance: be able to walk steady for longer time periods  OBJECTIVE:  03-30-22  GAIT: Gait pattern: step through pattern, decreased ankle dorsiflexion- Right, and lateral lean- Right Distance walked: 57'  Assistive device utilized:  hurry cane Level of assistance: Modified independence Comments: gait deviations increase with fatigue due to decreased muscle endurance; Rt knee instability noted - Rt knee hyperextension    THEREX:  Hamstring stretch (Runner's stretch with foot on 2nd step) 20 sec hold x 1 rep each leg  Gastroc stretch with forefoot on bottom shelf of cabinet 20 sec hold x 1 rep each leg  Bil. LE's heel raise 10 reps; unilateral heel raise 10 reps each LE with UE support on counter   Seated knee flexion with red theraband 10 reps each leg with 3 sec hold  Step up exercise - 10 reps each leg onto 6" step with 1 UE support for quad strengthening       NEURORE-ED:  Rt knee extension control exercise with red theraband - bil. Stance, forward and backward stance LLE - 15 reps each position  Exercises to increase hamstring and gastroc strength each LE and also balance - pt performed amb. On tiptoes with knees flexed forwards/backwards 2 reps inside // bars to have UE support prn Amb. Sideways 10' x 4 reps inside // bars - knees flexed and on tiptoes for plantarflexor strengthening with UE support prn for balance recovery  Pt stood on each leg - rolled ball with opposite foot small range forward/back 10 reps and then side to side for knee control and stability on stance leg - pt  performed inside // bars to have UE support prn - used 1 UE support   Access Code: [email or web address] URL: [email or web address] Date: 03/17/2022 Prepared by: Ethelene Browns  Exercises - Seated Hamstring Curl with Anchored Resistance  - 1 x daily - 7 x weekly - 1 sets - 10 reps - 5 sec hold - Prone Hamstring Curl with Anchored Resistance  - 1 x daily - 7 x weekly - 1 sets - 10 reps - 5 sec hold - Prone Hip Extension with Bent Knee  - 1 x daily - 7 x weekly - 1 sets - 10 reps  Access Code: [email or web address] URL: [email or web address] Date: 03/10/2022 Prepared by: Ethelene Browns  Exercises - Supine Bridge  - 1 x daily - 7 x weekly - 3 sets - 10 reps - Supine March  - 1 x daily - 7 x weekly - 1 sets - 10 reps - Bridge with knees apart - NO RESISTANCE  - 1 x daily - 7 x weekly - 1 sets - 5 reps - Supine Bridge with Knee Extension and Pelvic Floor Contraction  - 1 x daily - 7 x weekly - 1 sets - 5 reps - Hip Extension with Resistance Loop  - 1 x daily - 7 x weekly - 1 sets - 5-10 reps - Standing Hip Abduction with Counter Support  - 1 x daily - 7 x weekly - 1 sets - 10 reps - Seated Hamstring Curl with Anchored Resistance  - 1 x daily - 7 x weekly - 1 sets - 10 reps - Single Leg Heel Raise with Counter Support  - 1 x daily - 7 x weekly - 1 sets - 10 reps   PATIENT EDUCATION: Education details: [employer] [email or web address];   03-16-22 added [employer] exs. [email or web address] Person educated: Patient Education method: Explanation Education comprehension: verbalized understanding  HOME EXERCISE PROGRAM: To be established   GOALS: Goals reviewed with patient? Yes  SHORT TERM GOALS: Target  date: 04/02/2022  Initiate aquatic therapy for balance, strengthening and endurance training. Baseline: Goal status: IN PROGRESS - aquatic PT scheduled for 04-05-22  2.  Pt will amb. 350' without SPC on flat, even surface.  Baseline:  Goal status: IN PROGRESS  3.  Improve 5 times sit to stand score to </=12  secs to demo improved RLE strength. Baseline:  Goal status: INITIAL  4.  Pt will report ability to amb. 10" with use of SPC prn without needing seated rest period to demo improved endurance.  Baseline:  Goal status: INITIAL  5.  Independent in HEP for RLE strenghtening and balance exercises.  Baseline:  Goal status: Met - 03-30-22   LONG TERM GOALS: Target date: 04/28/2022  Pt will report ability to amb. 15" nonstop with SPC prn to demo improved endurance.  Baseline:  Goal status: INITIAL  2.  Improve 5 times sit to stand score to </= 10.5 secs to demo improved LE strength.  Baseline: 14.72 secs without UE support from chair Goal status: INITIAL  3.  Improve balance so pt able to perform SLS on RLE >/= 4 secs to increase safety with stepping over objects.  Baseline: RLE 1.79 secs; LLE 6.88 secs Goal status: INITIAL  4.  Independent in HEP for LE strengthening and stretching and also for aquatic exercise.  Baseline:  Goal status: INITIAL  5.  Pt will negotiate 4 steps without hand raill using step by step sequence for safety. Baseline:  Goal status: INITIAL  ASSESSMENT:  CLINICAL IMPRESSION: Pt has met STG's #1 and 5;  remaining STG's to be assessed at next land session.  PT session focused on bil. LE strengthening with emphasis on Rt knee control exercises as RLE is slightly weaker than LLE.  Pt reported RLE was beginning to drag due to fatigue with standing balance and functional strengthening exercises so PRE's then performed in seated position. Cont with POC.     OBJECTIVE IMPAIRMENTS: decreased activity tolerance, decreased balance, decreased endurance, difficulty walking, decreased strength, and impaired tone.   ACTIVITY LIMITATIONS: lifting, squatting, stairs, and locomotion level  PARTICIPATION LIMITATIONS: meal prep, cleaning, shopping, and community activity  PERSONAL FACTORS: 1 comorbidity: MS   are also affecting patient's functional outcome.   REHAB POTENTIAL:  Good  CLINICAL DECISION MAKING: Stable/uncomplicated  EVALUATION COMPLEXITY: Low  PLAN:  PT FREQUENCY: 1x/week  PT DURATION: 8 weeks  PLANNED INTERVENTIONS: Therapeutic exercises, Therapeutic activity, Neuromuscular re-education, Balance training, Gait training, Patient/Family education, Self Care, Stair training, and Aquatic Therapy  PLAN FOR NEXT SESSION:  check/review HEP; aquatic session scheduled for 04-05-22   Alda Lea, PT 03/31/2022, 12:08 PM

## 2022-03-31 ENCOUNTER — Encounter: Payer: Self-pay | Admitting: Physical Therapy

## 2022-04-01 NOTE — Progress Notes (Signed)
   [facility]  HOME SLEEP STUDY  STUDY DATE: 03/30/2022 PATIENT NAME: Brooke Ballard DOB: [birth year] MRN: [id number]  ORDERING CLINICIAN: Richard A. Epimenio Foot, MD, PhD INTERPRETING CLINICIAN: Richard A. Epimenio Foot, MD. PhD   CLINICAL INFORMATION: 51 year old woman with multiple sclerosis who also has fatigue, snoring and excessive daytime sleepiness.   IMPRESSION:  Normal sleep efficiency and percent stage REM sleep Moderate obstructive sleep apnea with a pAHI 3% of 18.5/h.  OSA was more severe during REM sleep with pAHI of 34.2/h Negligible nocturnal hypoxemia associated with OSA events   RECOMMENDATION: AutoPap with a range of 5-15 cm H2O with heated humidifier. Follow-up with Dr. Epimenio Foot.   INTERPRETING PHYSICIAN:   Richard A. Epimenio Foot, MD, PhD, [facility] Certified in Neurology, Clinical Neurophysiology, Sleep Medicine, Pain Medicine and Neuroimaging  [facility] [address], [address] [place], [place] [address] [phone]

## 2022-04-05 ENCOUNTER — Telehealth: Payer: Self-pay | Admitting: *Deleted

## 2022-04-05 ENCOUNTER — Ambulatory Visit: Payer: Self-pay | Admitting: Physical Therapy

## 2022-04-05 ENCOUNTER — Encounter: Payer: Self-pay | Admitting: Physical Therapy

## 2022-04-05 DIAGNOSIS — G4733 Obstructive sleep apnea (adult) (pediatric): Secondary | ICD-10-CM

## 2022-04-05 DIAGNOSIS — M6281 Muscle weakness (generalized): Secondary | ICD-10-CM | POA: Diagnosis not present

## 2022-04-05 DIAGNOSIS — R5383 Other fatigue: Secondary | ICD-10-CM

## 2022-04-05 DIAGNOSIS — R2689 Other abnormalities of gait and mobility: Secondary | ICD-10-CM

## 2022-04-05 DIAGNOSIS — R0683 Snoring: Secondary | ICD-10-CM

## 2022-04-05 DIAGNOSIS — R2681 Unsteadiness on feet: Secondary | ICD-10-CM

## 2022-04-05 NOTE — Therapy (Signed)
OUTPATIENT PHYSICAL THERAPY NEURO TREATMENT NOTE   Patient Name: Brooke Ballard MRN: [id number] DOB:1970-05-26, 51 y.o., female Today's Date: 04/06/2022   PCP: Willeen Niece, PA REFERRING PROVIDER: Britt Bottom, MD   PT End of Session - 04/05/22 2005     Visit Number 5    Number of Visits 13    Date for PT Re-Evaluation 04/16/22    Authorization Type UHC    PT Start Time 0630    PT Stop Time 1530    PT Time Calculation (min) 45 min    Equipment Utilized During Treatment Other (comment)   bar bells, aquatic cuff   Activity Tolerance Patient tolerated treatment well    Behavior During Therapy [facility] for tasks assessed/performed                Past Medical History:  Diagnosis Date   Borderline diabetes    Hypertension    Multiple sclerosis ([place])    Past Surgical History:  Procedure Laterality Date   ABDOMINAL HYSTERECTOMY     BACK SURGERY     c section x 2     Patient Active Problem List   Diagnosis Date Noted   Cluster headache 12/10/2014   Tobacco use disorder 07/18/2014   HTN (hypertension) 03/20/2014    ONSET DATE: August 2023  REFERRING DIAG:  Diagnosis  G35 (ICD-10-CM) - Multiple sclerosis ([place])  R26.9 (ICD-10-CM) - Gait disturbance  R29.898 (ICD-10-CM) - Right leg weakness  R26.89 (ICD-10-CM) - Balance disorder    THERAPY DIAG:  Muscle weakness (generalized)  Other abnormalities of gait and mobility  Unsteadiness on feet  Rationale for Evaluation and Treatment: Rehabilitation  SUBJECTIVE:                                                                                                                                                                                             SUBJECTIVE STATEMENT: Pt presents for aquatic therapy session at [place] - is ambulating with use of SPC:  states she parked on side of emergency entrance - had a long walk to the pool area Pt accompanied by: self  PERTINENT HISTORY: Borderline DM, HTN, h/o back  surgery approx. 10 yrs ago  PAIN:  Are you having pain? No  PRECAUTIONS: None  WEIGHT BEARING RESTRICTIONS: No  FALLS: Has patient fallen in last 6 months? No  LIVING ENVIRONMENT: Lives with: lives with their family Lives in: Other townhouse Stairs: Yes: Internal: 12 steps; bilateral but cannot reach both Has following equipment at home: Single point cane has rubber quad tip   PLOF: Independent  PATIENT GOALS: increase strength of Rt side and  balance: be able to walk steady for longer time periods  OBJECTIVE:  04-05-22  - Aquatic therapy at [facility] - pool temp 90 degrees   Patient seen for aquatic therapy today.  Treatment took place in water 3.5-4.0 feet deep depending upon activity.  Pt entered the pool via step negotiation with use of bil. Hand rails with supervision, using step by step sequence.     Pt performed bil. hamstring stretch (Runner's stretch against pool wall)  20 sec hold x 1 rep each leg   Gastroc stretch with forefoot on pool wall 20 sec hold x 1 rep each leg     Pt performed water walking for warm up - forwards 18' x 4 reps across pool, sideways 18' x 4 reps and backwards 18' x 2 reps  Pt performed RLE strengthening exercises with use of buoyant ankle cuff - hip flexion, extension, abduction, adduction and hip abdct/adduction with knee flexed at 90 degrees 10 reps each   Squats 10 reps with minimal bil. UE support on pool edge  Marching in place 10 reps each leg without UE support  Heel raises bil. LE's for gastroc strengthening 10 reps;  RLE unilateral heel raise 10 reps with minimal UE support on pool edge Pt performed amb. Forward across width of pool 18' x 2 reps on tip toes for gastroc strengthening and for improved balance Pt performed amb. 18' x 1 rep on heels for dorsiflexor strengthening  Pt performed simulated line dance move for improved coordination RLE and without fall risk as present on land with this activity/movement    Pt requires  buoyancy of water for support for reduced fall risk with aquatic exercise compared to land based exercises; buoyancy of water is also needed for joint offloading and spinal decompression as pt has hx of back surgery.  Viscosity of water is needed for strengthening.       Access Code: [email or web address] URL: [email or web address] Date: 03/17/2022 Prepared by: Ethelene Browns  Exercises - Seated Hamstring Curl with Anchored Resistance  - 1 x daily - 7 x weekly - 1 sets - 10 reps - 5 sec hold - Prone Hamstring Curl with Anchored Resistance  - 1 x daily - 7 x weekly - 1 sets - 10 reps - 5 sec hold - Prone Hip Extension with Bent Knee  - 1 x daily - 7 x weekly - 1 sets - 10 reps  Access Code: [email or web address] URL: [email or web address] Date: 03/10/2022 Prepared by: Ethelene Browns  Exercises - Supine Bridge  - 1 x daily - 7 x weekly - 3 sets - 10 reps - Supine March  - 1 x daily - 7 x weekly - 1 sets - 10 reps - Bridge with knees apart - NO RESISTANCE  - 1 x daily - 7 x weekly - 1 sets - 5 reps - Supine Bridge with Knee Extension and Pelvic Floor Contraction  - 1 x daily - 7 x weekly - 1 sets - 5 reps - Hip Extension with Resistance Loop  - 1 x daily - 7 x weekly - 1 sets - 5-10 reps - Standing Hip Abduction with Counter Support  - 1 x daily - 7 x weekly - 1 sets - 10 reps - Seated Hamstring Curl with Anchored Resistance  - 1 x daily - 7 x weekly - 1 sets - 10 reps - Single Leg Heel Raise with Counter Support  - 1 x daily - 7 x weekly - 1 sets - 10 reps  PATIENT EDUCATION: Education details: [employer] [email or web address];   03-16-22 added [employer] exs. [email or web address] Person educated: Patient Education method: Explanation Education comprehension: verbalized understanding  HOME EXERCISE PROGRAM: To be established   GOALS: Goals reviewed with patient? Yes  SHORT TERM GOALS: Target date: 04/02/2022  Initiate aquatic therapy for balance, strengthening and endurance training. Baseline: Goal  status: IN PROGRESS - aquatic PT scheduled for 04-05-22  2.  Pt will amb. 350' without SPC on flat, even surface.  Baseline:  Goal status: IN PROGRESS  3.  Improve 5 times sit to stand score to </=12 secs to demo improved RLE strength. Baseline:  Goal status: INITIAL  4.  Pt will report ability to amb. 10" with use of SPC prn without needing seated rest period to demo improved endurance.  Baseline:  Goal status: INITIAL  5.  Independent in HEP for RLE strenghtening and balance exercises.  Baseline:  Goal status: Met - 03-30-22   LONG TERM GOALS: Target date: 04/28/2022  Pt will report ability to amb. 15" nonstop with SPC prn to demo improved endurance.  Baseline:  Goal status: INITIAL  2.  Improve 5 times sit to stand score to </= 10.5 secs to demo improved LE strength.  Baseline: 14.72 secs without UE support from chair Goal status: INITIAL  3.  Improve balance so pt able to perform SLS on RLE >/= 4 secs to increase safety with stepping over objects.  Baseline: RLE 1.79 secs; LLE 6.88 secs Goal status: INITIAL  4.  Independent in HEP for LE strengthening and stretching and also for aquatic exercise.  Baseline:  Goal status: INITIAL  5.  Pt will negotiate 4 steps without hand raill using step by step sequence for safety. Baseline:  Goal status: INITIAL  ASSESSMENT:  CLINICAL IMPRESSION: Aquatic therapy session focused on RLE strengthening, gait and balance training.  Pt had mild LOB occasionally with aquatic exercises but was able to recover independently.   Pt reported mild fatigue at end of session.   Cont with POC.     OBJECTIVE IMPAIRMENTS: decreased activity tolerance, decreased balance, decreased endurance, difficulty walking, decreased strength, and impaired tone.   ACTIVITY LIMITATIONS: lifting, squatting, stairs, and locomotion level  PARTICIPATION LIMITATIONS: meal prep, cleaning, shopping, and community activity  PERSONAL FACTORS: 1 comorbidity: MS   are  also affecting patient's functional outcome.   REHAB POTENTIAL: Good  CLINICAL DECISION MAKING: Stable/uncomplicated  EVALUATION COMPLEXITY: Low  PLAN:  PT FREQUENCY: 1x/week  PT DURATION: 8 weeks  PLANNED INTERVENTIONS: Therapeutic exercises, Therapeutic activity, Neuromuscular re-education, Balance training, Gait training, Patient/Family education, Self Care, Stair training, and Aquatic Therapy  PLAN FOR NEXT SESSION:  check/review HEP; aquatic session scheduled for 04-05-22   Alda Lea, PT 04/06/2022, 8:18 AM

## 2022-04-05 NOTE — Telephone Encounter (Signed)
Called and spoke with pt about results per Dr. Bonnita Hollow note. Pt verbalized understanding.  Community message sent to [employer] that order placed.   Aware phone staff will be reaching back out to schedule her initial cpap f/u with our office.

## 2022-04-05 NOTE — Telephone Encounter (Signed)
-----   Message from Asa Lente, MD sent at 04/02/2022 12:47 PM EST ----- Regarding: HST Please let her know that the home sleep study showed that she had moderate sleep apnea (it was more severe during rapid eye movement sleep)  Because of this, I would like her to try AutoPap 5-15 cm, heated humidifier with download in 30 to 90 days and follow-up with me earlier than scheduled visit if needed  Thank you

## 2022-04-12 ENCOUNTER — Ambulatory Visit: Payer: 59 | Admitting: Physical Therapy

## 2022-05-05 ENCOUNTER — Encounter: Payer: Self-pay | Admitting: Neurology

## 2022-05-05 ENCOUNTER — Ambulatory Visit (INDEPENDENT_AMBULATORY_CARE_PROVIDER_SITE_OTHER): Payer: 59 | Admitting: Neurology

## 2022-05-05 VITALS — BP 125/86 | HR 67 | Ht 68.0 in | Wt 296.5 lb

## 2022-05-05 DIAGNOSIS — R5383 Other fatigue: Secondary | ICD-10-CM | POA: Diagnosis not present

## 2022-05-05 DIAGNOSIS — R269 Unspecified abnormalities of gait and mobility: Secondary | ICD-10-CM | POA: Diagnosis not present

## 2022-05-05 DIAGNOSIS — G35 Multiple sclerosis: Secondary | ICD-10-CM | POA: Diagnosis not present

## 2022-05-05 DIAGNOSIS — G35D Multiple sclerosis, unspecified: Secondary | ICD-10-CM

## 2022-05-05 DIAGNOSIS — G4733 Obstructive sleep apnea (adult) (pediatric): Secondary | ICD-10-CM

## 2022-05-05 DIAGNOSIS — R2689 Other abnormalities of gait and mobility: Secondary | ICD-10-CM

## 2022-05-05 DIAGNOSIS — R29898 Other symptoms and signs involving the musculoskeletal system: Secondary | ICD-10-CM

## 2022-05-05 NOTE — Progress Notes (Signed)
[facility]  PATIENT: Brooke Ballard DOB: Feb 15, 1971  REFERRING DOCTOR OR PCP: Willeen Niece, PA (PCP); Tommas Olp [facility] neurology) SOURCE: Patient,  _________________________________   HISTORICAL  CHIEF COMPLAINT:  Chief Complaint  Patient presents with   Room 10    Pt is here Alone. Pt states that she has good moments and bad moments. Pt states that she has muscle weakness on her right side. Pt states that she has tingling and burning in her hands and in her right foot.    Brooke Ballard is a 52 y.o. woman with Multiple Sclerosis  Update 05/05/2022 She started DMF in September 2023 and then switched to  [facility] with her infusions in November 2023.    She tolerated the infusions well (just mild HA).     She has not had any exacerbations or major new neurologic symptoms.  She notes continued balance issues, worse as day goes on or walking longer distance.  She feels she walks like a robot.  She feels she could only go 1/8 mile without a break.    The right side is worse and she notes the right leg hyperextends and gives out some.  She denies spasticity.   She is also experiencing dysesthesias in her head (midline) and upper chest (midline).  Dysesthesias are worse.  Bladder is fine.   She notes blurry vision in the late afternoon/evening.   Color vision is fine.   She has tinnitus.   She notes mild cognitive issues and notes some word finding difficulty and stuttering.   She has OSA and we ordered CPAP bur she has not started.   She falls asleep easily.   EDS is a little better.   She notes some difficulty with driving due to right leg symptoms.   She had been working in [place] but can't do the walking involved.    Vascular risks: Hypercholesterolemia, benign essential hypertension  No FH of MS or autoimmune disease.   Her mom has fibromyalgia.     MS History She had the onset of  reduced balance in November 2022. A month later, she had weakness on  her right side   She had a lot of fatigue.  Symptoms have persisted. She was experiencing more issues with head congestion and not feeling right and an MRI was ordered.  It showed changes concerning for MS and she was referred to Dr. Shela Leff [facility] neurology) who noted right-sided motor symptoms and dysmetria.    The MRI showed multiple T2/FLAIR hyperintense foci including one of the left middle cerebellar peduncle.  Several of the periventricular foci enhanced after contrast.   She tried Tecfidera but had tolerability issues.    She switched to [facility] 03/2022.      Imaging: MRI of the cervical and thoracic spine 01/06/2022 showed a normal spinal cord.  There was mild degenerative change at C4-C5.  A focus was seen in the pons that is nonspecific.  The sella turcica is mildly enlarged.  MRI of the brain 11/30/2021 showed multiple T2/FLAIR hyperintense lesions in the periventricular/subcortical white matter,  and one in left middle cerebellar peduncle.  Periventricular foci were oriented perpendicular to the ventricle.  Three of the periventricular foci demonstrated abnormal enhancement consistent with acute demyelination.  Labs: CSF 12/14/2021 showed 7 oligoclonal bands in the CSF not present in the serum.  The IgG index was elevated at 1.7  Labs 12/10/2021: Vitamin D was low at 21.  Vitamin B12 was normal.  Labs 09/25/2021: Hemoglobin A1c  is prediabetic at 6.2.  Total cholesterol was elevated at 221 and LDL at 128  REVIEW OF SYSTEMS: Constitutional: No fevers, chills, sweats, or change in appetite Eyes: No visual changes, double vision, eye pain Ear, nose and throat: No hearing loss, ear pain, nasal congestion, sore throat Cardiovascular: No chest pain, palpitations Respiratory:  No shortness of breath at rest or with exertion.   No wheezes GastrointestinaI: No nausea, vomiting, diarrhea, abdominal pain, fecal incontinence Genitourinary:  No dysuria, urinary retention or frequency.  No  nocturia. Musculoskeletal:  No neck pain, back pain Integumentary: No rash, pruritus, skin lesions Neurological: as above Psychiatric: No depression at this time.  No anxiety Endocrine: No palpitations, diaphoresis, change in appetite, change in weigh or increased thirst Hematologic/Lymphatic:  No anemia, purpura, petechiae. Allergic/Immunologic: No itchy/runny eyes, nasal congestion, recent allergic reactions, rashes  ALLERGIES: Allergies  Allergen Reactions   Other Shortness Of Breath, Itching and Swelling    ALL STONE FRUITS    Contrast Media [Iodinated Contrast Media] Itching and Swelling   Morphine And Related Itching and Swelling    HOME MEDICATIONS:  Current Outpatient Medications:    albuterol (VENTOLIN HFA) 108 (90 Base) MCG/ACT inhaler, Inhale 1 puff into the lungs as needed., Disp: , Rfl:    amLODipine (NORVASC) 10 MG tablet, Take 1 tablet by mouth daily., Disp: , Rfl:    aspirin EC 81 MG tablet, Take 81 mg by mouth daily., Disp: , Rfl:    atorvastatin (LIPITOR) 80 MG tablet, Take 80 mg by mouth daily., Disp: , Rfl:    BLACK CURRANT SEED OIL PO, Take 2.5 mLs by mouth daily., Disp: , Rfl:    buPROPion (WELLBUTRIN XL) 150 MG 24 hr tablet, Take 1 tablet by mouth daily., Disp: , Rfl:    cetirizine (ZYRTEC) 10 MG tablet, Take 10 mg by mouth as needed., Disp: , Rfl:    estradiol (ESTRACE) 2 MG tablet, Take 2 mg by mouth daily., Disp: , Rfl:    fluticasone (FLONASE) 50 MCG/ACT nasal spray, Place 2 sprays into both nostrils as needed., Disp: , Rfl:    hydrochlorothiazide (HYDRODIURIL) 25 MG tablet, Take 1 tablet by mouth daily., Disp: , Rfl:    losartan (COZAAR) 100 MG tablet, Take 1 tablet by mouth daily., Disp: , Rfl:    metoprolol tartrate (LOPRESSOR) 50 MG tablet, Take 75 mg by mouth 2 (two) times daily., Disp: , Rfl:    Multiple Vitamin (MULTIVITAMIN) LIQD, Take 15 mLs by mouth daily., Disp: , Rfl:    Ublituximab-xiiy (BRIUMVI) 150 MG/6ML SOLN, Inject into the vein. Day 1:  150mg , Day 15: 150mg  then 450mg  every 6 months thereafter, Disp: , Rfl:    valACYclovir (VALTREX) 1000 MG tablet, Take by mouth., Disp: , Rfl:    Vitamin D, Ergocalciferol, (DRISDOL) 1.25 MG (50000 UNIT) CAPS capsule, Take 50,000 Units by mouth once a week., Disp: , Rfl:    verapamil (CALAN) 120 MG tablet, Take 1 tablet (120 mg total) by mouth 2 (two) times daily., Disp: 60 tablet, Rfl: 1  PAST MEDICAL HISTORY: Past Medical History:  Diagnosis Date   Borderline diabetes    Hypertension    Multiple sclerosis (HCC)     PAST SURGICAL HISTORY: Past Surgical History:  Procedure Laterality Date   ABDOMINAL HYSTERECTOMY     BACK SURGERY     c section x 2      FAMILY HISTORY: Family History  Problem Relation Age of Onset   Hypertension Mother    Hypertension Father  SOCIAL HISTORY: Social History   Socioeconomic History   Marital status: Divorced    Spouse name: Not on file   Number of children: Not on file   Years of education: Not on file   Highest education level: Not on file  Occupational History   Not on file  Tobacco Use   Smoking status: Every Day    Packs/day: 0.50    Types: Cigarettes   Smokeless tobacco: Never   Tobacco comments:    Smoking 1 ppd  Substance and Sexual Activity   Alcohol use: No    Comment: socially- wine   Drug use: No   Sexual activity: Not on file  Other Topics Concern   Not on file  Social History Narrative   Right handed    16 oz coffee per day   Lives with child   Social Determinants of Health   Financial Resource Strain: Not on file  Food Insecurity: Not on file  Transportation Needs: Not on file  Physical Activity: Not on file  Stress: Not on file  Social Connections: Not on file  Intimate Partner Violence: Not on file       PHYSICAL EXAM  Vitals:   05/05/22 1047  BP: 125/86  Pulse: 67  Weight: 296 lb 8 oz (134.5 kg)  Height: 5\' 8"  (1.727 m)    Body mass index is 45.08 kg/m.   General: The patient is  well-developed and well-nourished and in no acute distress.    HEENT:  Head is [place]/AT.  Sclera are anicteric  Skin: Extremities are without rash or  edema.  Neurologic Exam  Mental status: The patient is alert and oriented x 3 at the time of the examination. The patient has apparent normal recent and remote memory, with an apparently normal attention span and concentration ability.   Speech is normal.  Cranial nerves: Extraocular movements are full. Pupils are equal, round, and reactive to light and accomodation.  Visual fields are full.  Facial symmetry is present.  She reports mildly reduced touch sensation and temperature sensation in the right face relative to the left.Facial strength is normal.  Trapezius and sternocleidomastoid strength is normal. No dysarthria is noted.  No obvious hearing deficits are noted.  Motor:  Muscle bulk is normal.   Tone is normal. Strength is  5 / 5 in all 4 extremities.   Sensory: She has reduced touch sensation in the right arm and reduced vibraiotn sensation in right leg relative to the left leg.    Coordination: Cerebellar testing reveals good finger-nose-finger and mildly reduced right heel-to-shin .  Gait and station: Station is normal.   The gait is minimally wide.  Tandem gait is wide.. Romberg is negative.   Reflexes: Deep tendon reflexes are symmetric and normal bilaterally.       DIAGNOSTIC DATA (LABS, IMAGING, TESTING) - I reviewed patient records, labs, notes, testing and imaging myself where available.  Lab Results  Component Value Date   WBC 13.6 (H) 02/02/2015   HGB 14.5 02/02/2015   HCT 42.6 02/02/2015   MCV 91.0 02/02/2015   PLT 294 02/02/2015      Component Value Date/Time   NA 139 02/02/2015 2053   K 3.6 02/02/2015 2053   CL 107 02/02/2015 2053   CO2 25 02/02/2015 2053   GLUCOSE 96 02/02/2015 2053   BUN 15 02/02/2015 2053   CREATININE 0.86 02/02/2015 2053   CREATININE 0.76 07/18/2014 1025   CALCIUM 9.4 02/02/2015 2053    PROT  6.2 07/18/2014 1025   ALBUMIN 3.7 07/18/2014 1025   AST 16 07/18/2014 1025   ALT 23 07/18/2014 1025   ALKPHOS 47 07/18/2014 1025   BILITOT 0.4 07/18/2014 1025   GFRNONAA >60 02/02/2015 2053   GFRNONAA >89 07/18/2014 1025   GFRAA >60 02/02/2015 2053   GFRAA >89 07/18/2014 1025   Lab Results  Component Value Date   CHOL 178 07/18/2014   HDL 32 (L) 07/18/2014   LDLCALC 124 (H) 07/18/2014   TRIG 108 07/18/2014   CHOLHDL 5.6 07/18/2014   Lab Results  Component Value Date   HGBA1C 6.1 (H) 07/18/2014   No results found for: "VITAMINB12" No results found for: "TSH"     ASSESSMENT AND PLAN  Multiple sclerosis (HCC)  OSA (obstructive sleep apnea)  Gait disturbance  Other fatigue  Right leg weakness  Balance disorder  Continue Briumvi.  Around the time of the next visit we will check some lab work.  Also around the time of the next visit we will check repeat MRI of the brain and cervical spine. Stay active and exercise as tolerated.  We discussed that weight loss could be helpful for her general health and possibly for the MS. Continue vitamin D supplements. She has OSA but has not yet begun CPAP.  I encouraged her to follow through with this.   Return in 6 months or sooner if there are new or worsening neurologic symptoms.  Ahleah Simko A. Felecia Shelling, MD, [facility] 7/49/4496, 7:59 PM Certified in Neurology, Clinical Neurophysiology, Sleep Medicine and Neuroimaging  [facility] [address], [place], [place] [address] [phone]

## 2022-05-13 NOTE — Telephone Encounter (Signed)
Patient's first Briumvi infusion was 03/09/22.

## 2022-05-27 ENCOUNTER — Ambulatory Visit (INDEPENDENT_AMBULATORY_CARE_PROVIDER_SITE_OTHER): Payer: 59 | Admitting: Family

## 2022-05-27 ENCOUNTER — Encounter: Payer: Self-pay | Admitting: Family

## 2022-05-27 VITALS — BP 118/78 | HR 93 | Temp 98.4°F | Resp 18 | Ht 68.0 in | Wt 297.6 lb

## 2022-05-27 DIAGNOSIS — I10 Essential (primary) hypertension: Secondary | ICD-10-CM

## 2022-05-27 DIAGNOSIS — G35 Multiple sclerosis: Secondary | ICD-10-CM

## 2022-05-27 DIAGNOSIS — F17200 Nicotine dependence, unspecified, uncomplicated: Secondary | ICD-10-CM | POA: Diagnosis not present

## 2022-05-27 DIAGNOSIS — F339 Major depressive disorder, recurrent, unspecified: Secondary | ICD-10-CM

## 2022-05-27 NOTE — Progress Notes (Signed)
Brooke Ballard is a 52 y.o. female with the following history as recorded in EpicCare:  Patient Active Problem List   Diagnosis Date Noted   Multiple sclerosis ([place]) 05/05/2022   Gait disturbance 05/05/2022   OSA (obstructive sleep apnea) 05/05/2022   Other fatigue 05/05/2022   Balance disorder 05/05/2022   Cluster headache 12/10/2014   Tobacco use disorder 07/18/2014   HTN (hypertension) 03/20/2014    Current Outpatient Medications  Medication Sig Dispense Refill   albuterol (VENTOLIN HFA) 108 (90 Base) MCG/ACT inhaler Inhale 1 puff into the lungs as needed.     amLODipine (NORVASC) 10 MG tablet Take 1 tablet by mouth daily.     aspirin EC 81 MG tablet Take 81 mg by mouth daily.     BLACK CURRANT SEED OIL PO Take 2.5 mLs by mouth daily.     buPROPion (WELLBUTRIN XL) 150 MG 24 hr tablet Take 1 tablet by mouth daily.     cetirizine (ZYRTEC) 10 MG tablet Take 10 mg by mouth as needed.     estradiol (ESTRACE) 2 MG tablet Take 2 mg by mouth daily.     fluticasone (FLONASE) 50 MCG/ACT nasal spray Place 2 sprays into both nostrils as needed.     hydrochlorothiazide (HYDRODIURIL) 25 MG tablet Take 1 tablet by mouth daily.     losartan (COZAAR) 100 MG tablet Take 1 tablet by mouth daily.     metoprolol tartrate (LOPRESSOR) 50 MG tablet Take 50 mg by mouth daily.     Multiple Vitamin (MULTIVITAMIN) LIQD Take 15 mLs by mouth daily.     Ublituximab-xiiy (BRIUMVI) 150 MG/6ML SOLN Inject into the vein. Day 1: 150mg , Day 15: 150mg  then 450mg  every 6 months thereafter     valACYclovir (VALTREX) 1000 MG tablet Take by mouth.     Vitamin D, Ergocalciferol, (DRISDOL) 1.25 MG (50000 UNIT) CAPS capsule Take 50,000 Units by mouth once a week.     No current facility-administered medications for this visit.    Allergies: Other, Contrast media [iodinated contrast media], and Morphine and related  Past Medical History:  Diagnosis Date   Borderline diabetes    Hypertension    Multiple sclerosis ([place])      Past Surgical History:  Procedure Laterality Date   ABDOMINAL HYSTERECTOMY     BACK SURGERY     c section x 2      Family History  Problem Relation Age of Onset   Hypertension Mother    Hypertension Father     Social History   Tobacco Use   Smoking status: Every Day    Packs/day: 0.50    Types: Cigarettes   Smokeless tobacco: Never   Tobacco comments:    Per patient, in process of cutting back from 1 ppd- down to 1/2 ppd   Substance Use Topics   Alcohol use: No    Comment: socially- wine    Subjective:   Presents today as a new patient; her PCP has moved to [place] and requesting new PCP;  History of MS- under care of neurology; has been diagnosed within the past year; under care of neurology at [facility];  Does see cardiology through [facility]- Dr. Shirlee More;  [facility]) ENT- managed by [facility];  GI scheduled for March 25 through [facility];      Objective:  Vitals:   05/27/22 0935  BP: 118/78  Pulse: 93  Resp: 18  Temp: 98.4 F (36.9 C)  TempSrc: Oral  SpO2: 99%  Weight: 297 lb 9.6 oz (135 kg)  Height: 5\' 8"  (1.727 m)    General: Well developed, well nourished, in no acute distress  Skin : Warm and dry.  Head: Normocephalic and atraumatic  Eyes: Sclera and conjunctiva clear; pupils round and reactive to light; extraocular movements intact  Ears: External normal; canals clear; tympanic membranes normal  Oropharynx: Pink, supple. No suspicious lesions  Neck: Supple without thyromegaly, adenopathy  Lungs: Respirations unlabored; clear to auscultation bilaterally without wheeze, rales, rhonchi  CVS exam: normal rate and regular rhythm.  Musculoskeletal: No deformities; no active joint inflammation  Extremities: No edema, cyanosis, clubbing  Vessels: Symmetric bilaterally  Neurologic: Alert and oriented; speech intact; face symmetrical; moves all extremities well; CNII-XII intact without focal deficit    Assessment:  1. Primary hypertension   2. Multiple sclerosis ([place])   3. Tobacco use disorder   4. Depression, recurrent ([place])     Plan:   Stable; continue same medications; Newly diagnosed in the past year- under care of neurology; Patient is not ready to quit completely at this time- she is working on cutting back; Stable; continue Wellbutrin XL;   Return in about 9 months (around 02/25/2023).  No orders of the defined types were placed in this encounter.   Requested Prescriptions    No prescriptions requested or ordered in this encounter

## 2022-06-08 ENCOUNTER — Ambulatory Visit (INDEPENDENT_AMBULATORY_CARE_PROVIDER_SITE_OTHER): Payer: 59 | Admitting: Family

## 2022-06-08 ENCOUNTER — Encounter: Payer: Self-pay | Admitting: Family

## 2022-06-08 VITALS — BP 118/82 | HR 75 | Ht 68.0 in | Wt 298.2 lb

## 2022-06-08 DIAGNOSIS — L732 Hidradenitis suppurativa: Secondary | ICD-10-CM

## 2022-06-08 DIAGNOSIS — N6002 Solitary cyst of left breast: Secondary | ICD-10-CM | POA: Diagnosis not present

## 2022-06-08 MED ORDER — DOXYCYCLINE HYCLATE 100 MG PO TABS: 100.0000 mg | ORAL_TABLET | Freq: Two times a day (BID) | ORAL | 0 refills | Status: DC

## 2022-06-08 NOTE — Progress Notes (Signed)
Brooke Ballard is a 52 y.o. female with the following history as recorded in EpicCare:  Patient Active Problem List   Diagnosis Date Noted   Multiple sclerosis ([place]) 05/05/2022   Gait disturbance 05/05/2022   OSA (obstructive sleep apnea) 05/05/2022   Other fatigue 05/05/2022   Balance disorder 05/05/2022   Cluster headache 12/10/2014   Tobacco use disorder 07/18/2014   HTN (hypertension) 03/20/2014    Current Outpatient Medications  Medication Sig Dispense Refill   albuterol (VENTOLIN HFA) 108 (90 Base) MCG/ACT inhaler Inhale 1 puff into the lungs as needed.     amLODipine (NORVASC) 10 MG tablet Take 1 tablet by mouth daily.     aspirin EC 81 MG tablet Take 81 mg by mouth daily.     BLACK CURRANT SEED OIL PO Take 2.5 mLs by mouth daily.     buPROPion (WELLBUTRIN XL) 150 MG 24 hr tablet Take 1 tablet by mouth daily.     cetirizine (ZYRTEC) 10 MG tablet Take 10 mg by mouth as needed.     doxycycline (VIBRA-TABS) 100 MG tablet Take 1 tablet (100 mg total) by mouth 2 (two) times daily. 14 tablet 0   estradiol (ESTRACE) 2 MG tablet Take 2 mg by mouth daily.     fluticasone (FLONASE) 50 MCG/ACT nasal spray Place 2 sprays into both nostrils as needed.     hydrochlorothiazide (HYDRODIURIL) 25 MG tablet Take 1 tablet by mouth daily.     losartan (COZAAR) 100 MG tablet Take 1 tablet by mouth daily.     metoprolol tartrate (LOPRESSOR) 50 MG tablet Take 50 mg by mouth daily.     Multiple Vitamin (MULTIVITAMIN) LIQD Take 15 mLs by mouth daily.     Ublituximab-xiiy (BRIUMVI) 150 MG/6ML SOLN Inject into the vein. Day 1: 112m, Day 15: 1566mthen 45026mvery 6 months thereafter     valACYclovir (VALTREX) 1000 MG tablet Take by mouth.     Vitamin D, Ergocalciferol, (DRISDOL) 1.25 MG (50000 UNIT) CAPS capsule Take 50,000 Units by mouth once a week.     No current facility-administered medications for this visit.    Allergies: Other, Contrast media [iodinated contrast media], and Morphine and related   Past Medical History:  Diagnosis Date   Borderline diabetes    Hypertension    Multiple sclerosis ([place]   Past Surgical History:  Procedure Laterality Date   ABDOMINAL HYSTERECTOMY     BACK SURGERY     c section x 2      Family History  Problem Relation Age of Onset   Hypertension Mother    Hypertension Father     Social History   Tobacco Use   Smoking status: Every Day    Packs/day: 0.50    Types: Cigarettes   Smokeless tobacco: Never   Tobacco comments:    Per patient, in process of cutting back from 1 ppd- down to 1/2 ppd   Substance Use Topics   Alcohol use: No    Comment: socially- wine    Subjective:   Left breast cyst x 1 week; notes is prone to recurrent breast cysts; has had to see breast surgeon in the past;   Objective:  Vitals:   06/08/22 1040  BP: 118/82  Pulse: 75  SpO2: 98%  Weight: 298 lb 3.2 oz (135.3 kg)  Height: 5' 8"$  (1.727 m)    General: Well developed, well nourished, in no acute distress  Skin : Warm and dry. Located beneath left breast along  rib cage;  Head: Normocephalic and atraumatic  Lungs: Respirations unlabored;  Neurologic: Alert and oriented; speech intact; face symmetrical; moves all extremities well; CNII-XII intact without focal deficit   Assessment:  1. Cyst of left breast   2. Hidradenitis     Plan:  Location is more consistent with hidradenitis but patient has history of breast abscess; will go ahead and start Doxycycline; refer to surgeon for further evaluation.   No follow-ups on file.  Orders Placed This Encounter  Procedures   HM MAMMOGRAPHY    This external order was created through the Results Console.   Ambulatory referral to General Surgery    Referral Priority:   Emergency    Referral Type:   Surgical    Referral Reason:   Specialty Services Required    Requested Specialty:   General Surgery    Number of Visits Requested:   1    Requested Prescriptions   Signed Prescriptions Disp Refills    doxycycline (VIBRA-TABS) 100 MG tablet 14 tablet 0    Sig: Take 1 tablet (100 mg total) by mouth 2 (two) times daily.

## 2022-07-19 LAB — HM COLONOSCOPY

## 2022-08-23 ENCOUNTER — Telehealth: Payer: Self-pay | Admitting: Neurology

## 2022-08-23 NOTE — Telephone Encounter (Signed)
Brooke Ballard called from [facility]. Stated she needs to talk to nurse about pt insurance and co-pay  Briumvi.

## 2022-08-23 NOTE — Telephone Encounter (Signed)
Called [employer] and was told that pt has 2 insurances and they can only bill 1 which is her primary insurance. They can't bill her medicaid for her Infusion. They are willing to just bill her primary insurance but her Co-Pay will be $4,000. They are reaching ou to [place] to see if we can do Buy & Bill for her infusion. Told them Dr. Epimenio Foot will be notified about this matter.

## 2022-09-07 ENCOUNTER — Ambulatory Visit (INDEPENDENT_AMBULATORY_CARE_PROVIDER_SITE_OTHER): Payer: 59 | Admitting: Family

## 2022-09-07 VITALS — BP 132/76 | HR 68 | Ht 68.0 in | Wt 297.4 lb

## 2022-09-07 DIAGNOSIS — I10 Essential (primary) hypertension: Secondary | ICD-10-CM

## 2022-09-07 DIAGNOSIS — F17200 Nicotine dependence, unspecified, uncomplicated: Secondary | ICD-10-CM

## 2022-09-07 DIAGNOSIS — F4321 Adjustment disorder with depressed mood: Secondary | ICD-10-CM

## 2022-09-07 MED ORDER — HYDROCHLOROTHIAZIDE 25 MG PO TABS: 25.0000 mg | ORAL_TABLET | Freq: Every day | ORAL | 3 refills | Status: DC

## 2022-09-07 MED ORDER — BUPROPION HCL ER (XL) 150 MG PO TB24: 150.0000 mg | ORAL_TABLET | Freq: Every day | ORAL | 0 refills | Status: DC

## 2022-09-07 NOTE — Progress Notes (Signed)
Brooke Ballard is a 52 y.o. female with the following history as recorded in Epic[employer]:  Patient Active Problem List   Diagnosis Date Noted   Multiple sclerosis (HCC) 05/05/2022   Gait disturbance 05/05/2022   OSA (obstructive sleep apnea) 05/05/2022   Other fatigue 05/05/2022   Balance disorder 05/05/2022   Cluster headache 12/10/2014   Tobacco use disorder 07/18/2014   HTN (hypertension) 03/20/2014    Current Outpatient Medications  Medication Sig Dispense Refill   albuterol (VENTOLIN HFA) 108 (90 Base) MCG/ACT inhaler Inhale 1 puff into the lungs as needed.     amLODipine (NORVASC) 10 MG tablet Take 1 tablet by mouth daily.     aspirin EC 81 MG tablet Take 81 mg by mouth daily.     BLACK CURRANT SEED OIL PO Take 2.5 mLs by mouth daily.     cetirizine (ZYRTEC) 10 MG tablet Take 10 mg by mouth as needed.     estradiol (ESTRACE) 2 MG tablet Take 2 mg by mouth daily.     fluticasone (FLONASE) 50 MCG/ACT nasal spray Place 2 sprays into both nostrils as needed.     losartan (COZAAR) 100 MG tablet Take 1 tablet by mouth daily.     metoprolol tartrate (LOPRESSOR) 50 MG tablet Take 50 mg by mouth 2 (two) times daily.     Multiple Vitamin (MULTIVITAMIN) LIQD Take 15 mLs by mouth daily.     Ublituximab-xiiy (BRIUMVI) 150 MG/6ML SOLN Inject into the vein. Day 1: 150mg , Day 15: 150mg  then 450mg  every 6 months thereafter     valACYclovir (VALTREX) 1000 MG tablet Take by mouth.     Vitamin D, Ergocalciferol, (DRISDOL) 1.25 MG (50000 UNIT) CAPS capsule Take 50,000 Units by mouth once a week.     buPROPion (WELLBUTRIN XL) 150 MG 24 hr tablet Take 1 tablet (150 mg total) by mouth daily. 30 tablet 0   hydrochlorothiazide (HYDRODIURIL) 25 MG tablet Take 1 tablet (25 mg total) by mouth daily. 90 tablet 3   No current facility-administered medications for this visit.    Allergies: Other, Contrast media [iodinated contrast media], and Morphine and related  Past Medical History:  Diagnosis Date    Borderline diabetes    Hypertension    Multiple sclerosis (HCC)     Past Surgical History:  Procedure Laterality Date   ABDOMINAL HYSTERECTOMY     BACK SURGERY     c section x 2      Family History  Problem Relation Age of Onset   Hypertension Mother    Hypertension Father     Social History   Tobacco Use   Smoking status: Every Day    Packs/day: .5    Types: Cigarettes   Smokeless tobacco: Never   Tobacco comments:    Per patient, in process of cutting back from 1 ppd- down to 1/2 ppd   Substance Use Topics   Alcohol use: No    Comment: socially- wine    Subjective:   Increased problems with depression; has been off her Wellbutrin XL x 1 month- did feel that the medication was beneficial and would like to re-start; would also be interested in meeting with a therapist- struggling to process all the life changes associated with her MS diagnosis;   Objective:  Vitals:   09/07/22 1527  BP: 132/76  Pulse: 68  SpO2: 98%  Weight: 297 lb 6.4 oz (134.9 kg)  Height: 5\' 8"  (1.727 m)    General: Well developed, well nourished, in no  acute distress  Skin : Warm and dry.  Head: Normocephalic and atraumatic  Lungs: Respirations unlabored; clear to auscultation bilaterally without wheeze, rales, rhonchi  CVS exam: normal rate and regular rhythm.  Neurologic: Alert and oriented; speech intact; face symmetrical; moves all extremities well; CNII-XII intact without focal deficit   Assessment:  1. Situational depression   2. Primary hypertension   3. Tobacco use disorder     Plan:  Re-start Wellbutrin XL 150 mg- to consider increasing to 300 mg daily; referral to behavioral health;  Stable; refill updated as requested; Patient has set a quit date of June 1- congratulated patient on this commitment to her health;  No follow-ups on file.  Orders Placed This Encounter  Procedures   Ambulatory referral to Behavioral Health    Referral Priority:   Routine    Referral Type:    Psychiatric    Referral Reason:   Specialty Services Required    Requested Specialty:   Behavioral Health    Number of Visits Requested:   1    Requested Prescriptions   Signed Prescriptions Disp Refills   buPROPion (WELLBUTRIN XL) 150 MG 24 hr tablet 30 tablet 0    Sig: Take 1 tablet (150 mg total) by mouth daily.   hydrochlorothiazide (HYDRODIURIL) 25 MG tablet 90 tablet 3    Sig: Take 1 tablet (25 mg total) by mouth daily.

## 2022-10-02 ENCOUNTER — Other Ambulatory Visit: Payer: Self-pay | Admitting: Family

## 2022-10-20 ENCOUNTER — Other Ambulatory Visit: Payer: Self-pay | Admitting: Family

## 2022-10-21 ENCOUNTER — Encounter: Payer: Self-pay | Admitting: Neurology

## 2022-10-25 ENCOUNTER — Ambulatory Visit (INDEPENDENT_AMBULATORY_CARE_PROVIDER_SITE_OTHER): Payer: 59 | Admitting: Neurology

## 2022-10-25 ENCOUNTER — Encounter: Payer: Self-pay | Admitting: Neurology

## 2022-10-25 ENCOUNTER — Encounter: Payer: Self-pay | Admitting: Family

## 2022-10-25 VITALS — BP 127/92 | HR 77 | Ht 68.0 in | Wt 297.5 lb

## 2022-10-25 DIAGNOSIS — G35 Multiple sclerosis: Secondary | ICD-10-CM

## 2022-10-25 DIAGNOSIS — G4733 Obstructive sleep apnea (adult) (pediatric): Secondary | ICD-10-CM

## 2022-10-25 DIAGNOSIS — R208 Other disturbances of skin sensation: Secondary | ICD-10-CM

## 2022-10-25 DIAGNOSIS — R269 Unspecified abnormalities of gait and mobility: Secondary | ICD-10-CM | POA: Diagnosis not present

## 2022-10-25 DIAGNOSIS — H539 Unspecified visual disturbance: Secondary | ICD-10-CM

## 2022-10-25 MED ORDER — PREDNISONE 50 MG PO TABS: ORAL_TABLET | ORAL | 0 refills | Status: DC

## 2022-10-25 NOTE — Progress Notes (Signed)
[facility]  PATIENT: Brooke Ballard DOB: 04/13/1971  REFERRING DOCTOR OR PCP: Maud Deed, PA (PCP); Canary Br[facility] neurology) SOURCE: Patient,  _________________________________   HISTORICAL  CHIEF COMPLAINT:  Chief Complaint  Patient presents with   EMG Room 1    Pt is here Alone. Dicussed Infusion options with pt and pt decided to go with Home Infusion. Pt states that her vision is okay for right now but during the day her vision is gets blurry in both of her eyes. Pt states that her back is hurting and feels tight that started Saturday.  Pt states that she has more Fatigue throughout the day. Pt states that both of her feet are numb.     Brooke Ballard is a 52 y.o. woman with Multiple Sclerosis  Update 10/25/2022 Starting last week, she had the onset of right blurry vision.  She was better the next day but still coming/going.    She has a pulling sensation in her back and legs.  At the onset she had right eye pain x 1 day.    Dysesthesias are also worse.     She switched to  [facility] with her first infusions in October/November 2023.    She tolerated the infusions well (just mild HA).  Due to insurance she never had her Briumvi In April 2024.    Seems [facility] only allows medication to be shipped to her home?   She has not had any exacerbations or major new neurologic symptoms.  She has a reduced gait and uses a cane.    She does worse as day goes on.  When well rested could do 1/4 mile.     The right side is worse and she notes the right leg hyperextends and gives out some.  She denies spasticity.   Shehas dysesthesias in the  chest (midline).  Dysesthesias are worse.  Bladder is fine.   She notes blurry vision in the late afternoon/evening.   Color vision is fine.   She sometimes notes tinnitus.   She notes mild cognitive issues and notes some word finding difficulty and stuttering.   She has OSA (moderate with AHI = 18.5 on HST 03/30/2022) and we  ordered CPAP bur she has not started.  She would like to hold off and we discussed weight loss as well.  .   She falls asleep easily.   EDS is a little better.   She notes some difficulty with driving due to right leg symptoms.   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    Vascular risks: Hypercholesterolemia, benign essential hypertension  No FH of MS or autoimmune disease.   Her mom has fibromyalgia.     MS History She had the onset of  reduced balance in November 2022. A month later, she had weakness on her right side   She had a lot of fatigue.  Symptoms have persisted. She was experiencing more issues with head congestion and not feeling right and an MRI was ordered.  It showed changes concerning for MS and she was referred to Dr. Rosalyn Gess [facility] neurology) who noted right-sided motor symptoms and dysmetria.    The MRI showed multiple T2/FLAIR hyperintense foci including one of the left middle cerebellar peduncle.  Several of the periventricular foci enhanced after contrast.   She tried Tecfidera but had tolerability issues.    She switched to [facility] 03/2022.      Imaging: MRI of the cervical  and thoracic spine 01/06/2022 showed a normal spinal cord.  There was mild degenerative change at C4-C5.  A focus was seen in the pons that is nonspecific.  The sella turcica is mildly enlarged.  MRI of the brain 11/30/2021 showed multiple T2/FLAIR hyperintense lesions in the periventricular/subcortical white matter,  and one in left middle cerebellar peduncle.  Periventricular foci were oriented perpendicular to the ventricle.  Three of the periventricular foci demonstrated abnormal enhancement consistent with acute demyelination.  Labs: CSF 12/14/2021 showed 7 oligoclonal bands in the CSF not present in the serum.  The IgG index was elevated at 1.7  Labs 12/10/2021: Vitamin D was low at 21.  Vitamin B12 was normal.  Labs 09/25/2021: Hemoglobin A1c is prediabetic at 6.2.   Total cholesterol was elevated at 221 and LDL at 128  REVIEW OF SYSTEMS: Constitutional: No fevers, chills, sweats, or change in appetite Eyes: No visual changes, double vision, eye pain Ear, nose and throat: No hearing loss, ear pain, nasal congestion, sore throat Cardiovascular: No chest pain, palpitations Respiratory:  No shortness of breath at rest or with exertion.   No wheezes GastrointestinaI: No nausea, vomiting, diarrhea, abdominal pain, fecal incontinence Genitourinary:  No dysuria, urinary retention or frequency.  No nocturia. Musculoskeletal:  No neck pain, back pain Integumentary: No rash, pruritus, skin lesions Neurological: as above Psychiatric: No depression at this time.  No anxiety Endocrine: No palpitations, diaphoresis, change in appetite, change in weigh or increased thirst Hematologic/Lymphatic:  No anemia, purpura, petechiae. Allergic/Immunologic: No itchy/runny eyes, nasal congestion, recent allergic reactions, rashes  ALLERGIES: Allergies  Allergen Reactions   Other Shortness Of Breath, Itching and Swelling    ALL STONE FRUITS    Contrast Media [Iodinated Contrast Media] Itching and Swelling   Morphine And Codeine Itching and Swelling    HOME MEDICATIONS:  Current Outpatient Medications:    albuterol (VENTOLIN HFA) 108 (90 Base) MCG/ACT inhaler, Inhale 1 puff into the lungs as needed., Disp: , Rfl:    amLODipine (NORVASC) 10 MG tablet, Take 1 tablet by mouth daily., Disp: , Rfl:    BLACK CURRANT SEED OIL PO, Take 2.5 mLs by mouth daily., Disp: , Rfl:    buPROPion (WELLBUTRIN XL) 150 MG 24 hr tablet, TAKE 1 TABLET BY MOUTH EVERY DAY, Disp: 30 tablet, Rfl: 0   cetirizine (ZYRTEC) 10 MG tablet, Take 10 mg by mouth as needed., Disp: , Rfl:    estradiol (ESTRACE) 2 MG tablet, Take 2 mg by mouth daily., Disp: , Rfl:    fluticasone (FLONASE) 50 MCG/ACT nasal spray, Place 2 sprays into both nostrils as needed., Disp: , Rfl:    hydrochlorothiazide (HYDRODIURIL) 25  MG tablet, Take 1 tablet (25 mg total) by mouth daily., Disp: 90 tablet, Rfl: 3   losartan (COZAAR) 100 MG tablet, Take 1 tablet by mouth daily., Disp: , Rfl:    metoprolol tartrate (LOPRESSOR) 50 MG tablet, Take 50 mg by mouth 2 (two) times daily., Disp: , Rfl:    Multiple Vitamin (MULTIVITAMIN) LIQD, Take 15 mLs by mouth daily., Disp: , Rfl:    predniSONE (DELTASONE) 50 MG tablet, 12 pills (600 mg) po every day x 2 days for MS exacerbation, Disp: 24 tablet, Rfl: 0   Ublituximab-xiiy (BRIUMVI) 150 MG/6ML SOLN, Inject into the vein. Day 1: 150mg , Day 15: 150mg  then 450mg  every 6 months thereafter, Disp: , Rfl:    valACYclovir (VALTREX) 1000 MG tablet, Take by mouth., Disp: , Rfl:    Vitamin D, Ergocalciferol, (DRISDOL) 1.25  MG (50000 UNIT) CAPS capsule, Take 50,000 Units by mouth once a week., Disp: , Rfl:   PAST MEDICAL HISTORY: Past Medical History:  Diagnosis Date   Borderline diabetes    Hypertension    Multiple sclerosis (HCC)     PAST SURGICAL HISTORY: Past Surgical History:  Procedure Laterality Date   ABDOMINAL HYSTERECTOMY     BACK SURGERY     c section x 2      FAMILY HISTORY: Family History  Problem Relation Age of Onset   Hypertension Mother    Hypertension Father     SOCIAL HISTORY: Social History   Socioeconomic History   Marital status: Divorced    Spouse name: Not on file   Number of children: Not on file   Years of education: Not on file   Highest education level: Some college, no degree  Occupational History   Not on file  Tobacco Use   Smoking status: Every Day    Packs/day: .5    Types: Cigarettes   Smokeless tobacco: Never   Tobacco comments:    Per patient, in process of cutting back from 1 ppd- down to 1/2 ppd   Substance and Sexual Activity   Alcohol use: No    Comment: socially- wine   Drug use: No   Sexual activity: Not on file  Other Topics Concern   Not on file  Social History Narrative   Right handed    16 oz coffee per day    Lives with child   Social Determinants of Health   Financial Resource Strain: Medium Risk (09/07/2022)   Overall Financial Resource Strain (CARDIA)    Difficulty of Paying Living Expenses: Somewhat hard  Food Insecurity: Food Insecurity Present (09/07/2022)   Hunger Vital Sign    Worried About Running Out of Food in the Last Year: Sometimes true    Ran Out of Food in the Last Year: Never true  Transportation Needs: No Transportation Needs (09/07/2022)   PRAPARE - Administrator, Civil Service (Medical): No    Lack of Transportation (Non-Medical): No  Physical Activity: Insufficiently Active (09/07/2022)   Exercise Vital Sign    Days of Exercise per Week: 1 day    Minutes of Exercise per Session: 10 min  Stress: Stress Concern Present (09/07/2022)   [employer] of Occupational Health - Occupational Stress Questionnaire    Feeling of Stress : Rather much  Social Connections: Moderately Isolated (09/07/2022)   Social Connection and Isolation Panel [NHANES]    Frequency of Communication with Friends and Family: More than three times a week    Frequency of Social Gatherings with Friends and Family: Once a week    Attends Religious Services: 1 to 4 times per year    Active Member of [employer] or Organizations: No    Attends Banker Meetings: Not on file    Marital Status: Widowed  Intimate Partner Violence: Not on file       PHYSICAL EXAM  Vitals:   10/25/22 1338  BP: (!) 127/92  Pulse: 77  Weight: 297 lb 8 oz (134.9 kg)  Height: 5\' 8"  (1.727 m)     Body mass index is 45.23 kg/m.  VA 20/30 OS; 20/30 OD  General: The patient is well-developed and well-nourished and in no acute distress.    HEENT:  Head is [place]/AT.  Sclera are anicteric  Skin: Extremities are without rash or  edema.  Neurologic Exam  Mental status: The patient is  alert and oriented x 3 at the time of the examination. The patient has apparent normal recent and remote memory, with  an apparently normal attention span and concentration ability.   Speech is normal.  Cranial nerves: Extraocular movements are full. Pupils are equal, round, and reactive to light and accomodation.,  Color vision was symmetric.  Although formal visual acuity testing was fairly symmetric she noted better vision from the left than the right.  She reports mildly reduced touch sensation and temperature sensation in the right face relative to the left.Facial strength is normal.  Trapezius and sternocleidomastoid strength is normal. No dysarthria is noted.  No obvious hearing deficits are noted.  Motor:  Muscle bulk is normal.   Tone is normal. Strength is  5 / 5 in all 4 extremities.   Sensory: She has reduced touch sensation in the right arm and reduced vibraiotn sensation in right leg relative to the left leg.    Coordination: Cerebellar testing reveals good finger-nose-finger and mildly reduced right heel-to-shin .  Gait and station: Station is normal.  Her gait is mildly wide.  Tandem gait is poor.. Romberg is negative.   Reflexes: Deep tendon reflexes are symmetric and normal bilaterally.       DIAGNOSTIC DATA (LABS, IMAGING, TESTING) - I reviewed patient records, labs, notes, testing and imaging myself where available.  Lab Results  Component Value Date   WBC 13.6 (H) 02/02/2015   HGB 14.5 02/02/2015   HCT 42.6 02/02/2015   MCV 91.0 02/02/2015   PLT 294 02/02/2015      Component Value Date/Time   NA 139 02/02/2015 2053   K 3.6 02/02/2015 2053   CL 107 02/02/2015 2053   CO2 25 02/02/2015 2053   GLUCOSE 96 02/02/2015 2053   BUN 15 02/02/2015 2053   CREATININE 0.86 02/02/2015 2053   CREATININE 0.76 07/18/2014 1025   CALCIUM 9.4 02/02/2015 2053   PROT 6.2 07/18/2014 1025   ALBUMIN 3.7 07/18/2014 1025   AST 16 07/18/2014 1025   ALT 23 07/18/2014 1025   ALKPHOS 47 07/18/2014 1025   BILITOT 0.4 07/18/2014 1025   GFRNONAA >60 02/02/2015 2053   GFRNONAA >89 07/18/2014 1025    GFRAA >60 02/02/2015 2053   GFRAA >89 07/18/2014 1025   Lab Results  Component Value Date   CHOL 178 07/18/2014   HDL 32 (L) 07/18/2014   LDLCALC 124 (H) 07/18/2014   TRIG 108 07/18/2014   CHOLHDL 5.6 07/18/2014   Lab Results  Component Value Date   HGBA1C 6.1 (H) 07/18/2014   No results found for: "VITAMINB12" No results found for: "TSH"     ASSESSMENT AND PLAN  Multiple sclerosis (HCC) - Plan: MR BRAIN W WO CONTRAST, MR CERVICAL SPINE W WO CONTRAST  Gait disturbance - Plan: MR BRAIN W WO CONTRAST, MR CERVICAL SPINE W WO CONTRAST  Vision disturbance  OSA (obstructive sleep apnea)  Dysesthesia  Continue Briumvi.  We will try to help her get her next infusion (may need to be at home).   Check repeat MRI of the brain and cervical spine. Stay active and exercise as tolerated.  We discussed that weight loss could be helpful for her general health and possibly for the MS. Continue vitamin D supplements. She has OSA but never got her CPAP.  I encouraged her to follow through.  She would like to hold off. Return in 6 months or sooner if there are new or worsening neurologic symptoms.  Jaycen Vercher A. Epimenio Foot, MD, [facility]  10/25/2022, 3:39 PM Certified in Neurology, Clinical Neurophysiology, Sleep Medicine and Neuroimaging  [facility] [address], [address] [place], [place] [address] [phone]

## 2022-10-27 ENCOUNTER — Telehealth: Payer: Self-pay | Admitting: Neurology

## 2022-10-27 NOTE — Telephone Encounter (Signed)
MRI brain [facility] auth: [id number] exp. 10/27/22-12/11/22, [facility] [place]: [id number] exp. 10/27/22-12/25/22  MRI cervical [employer] auth: [id number] exp. 10/27/22-12/11/22, [facility] [place]: [id number] exp. 10/27/22-12/25/22 sent to GI [phone]

## 2022-11-03 ENCOUNTER — Ambulatory Visit: Payer: 59 | Admitting: Neurology

## 2022-11-09 ENCOUNTER — Ambulatory Visit (INDEPENDENT_AMBULATORY_CARE_PROVIDER_SITE_OTHER): Payer: 59 | Admitting: Behavioral Health

## 2022-11-09 DIAGNOSIS — F4323 Adjustment disorder with mixed anxiety and depressed mood: Secondary | ICD-10-CM | POA: Diagnosis not present

## 2022-11-09 NOTE — Progress Notes (Signed)
                Victoria L Winstead, LMFT 

## 2022-11-09 NOTE — Progress Notes (Addendum)
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 Pt is aware of the risks/limitations of telehealth & consents to Tx today.  Time In: 10:00am Time Out: 11:00am  Guardian/Payee:  [employer] requested: No   Reason for Visit /Presenting Problem: Elevated anx/dep & stress due to recent Dx of MS in 25-Nov-2021 & the disposition of her 3 Sons since the death of their Fr in 11-26-19.  Mental Status Exam: Appearance:   Casual     Behavior:  Appropriate and Sharing  Motor:  Normal  Speech/Language:   Clear and Coherent  Affect:  Appropriate  Mood:  normal  Thought process:  normal  Thought content:    WNL  Sensory/Perceptual disturbances:    WNL  Orientation:  oriented to person, place, time/date, and situation  Attention:  Good  Concentration:  Good  Memory:  WNL  Fund of knowledge:   Good  Insight:    Good  Judgment:   Good  Impulse Control:  Good    Risk Assessment: Danger to Self:  No Self-injurious Behavior: No Danger to Others: No Duty to Warn:no Physical Aggression / Violence:No  Access to Firearms a concern: No  Gang Involvement:No  Patient / guardian was educated about steps to take if suicide or homicide risk level increases between visits: yes; appropriate to ICD process While future psychiatric events cannot be accurately predicted, the patient does not currently require acute inpatient psychiatric care and does not currently meet [facility] involuntary commitment criteria.  Substance Abuse History: Current substance abuse: No     Past Psychiatric History:   No previous psychological problems have been observed Outpatient Providers: Ria Clock, FNP History of Psych Hospitalization: No  Psychological Testing:  NA     Abuse History:  Victim of: No.,  NA    Report needed: No. Victim of Neglect:No. Perpetrator of  NA   Witness / Exposure to Domestic Violence: No   Protective Services Involvement: No  Witness to [employer] Violence:  No   Family History:  Family History  Problem Relation Age of Onset   Hypertension Mother    Hypertension Father     Living situation: the patient lives with their family  Sexual Orientation: Straight  Relationship Status: single  Name of spouse / other: BF Scott If a parent, number of children / ages: 18yo Son Lesia Sago who lives @ home & works for [employer] in [employer]. He relies on Mom for transportation.   42yo Duwayne Heck who lives w/a friend of Pt who care for him. He is Dx'd w/Schizophrenia since the agge of 52yo. He is a quiet young man & has regressed since his Dad died in 11-26-19. He has a tendency to run & the Police will find him in strange places. He did not like living away from home, but @ the time, it was difficult for Mom to care for him.  29yo Genesis lives independently. He has since transitioned into being a female. He had surgery about 5 yrs ago. 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  Pt was married to her Son's Kendrick Fries in 11/26/94. They divorced in 26-Nov-2006. When Dad died, Duwayne Heck took it the hardest. He was @ the [place] when Dad died from a cardiac  infection as the result of Dialysis. Each Son had a unique relationship w/their Dad. It has been difficult the past few yrs.   Support Systems: Significant Other Scott [phone]) who works 3rd Shift. He has 4 children; a set of m/f Twins, a 22yo Son & a rising Sr. Soloman. They have dated a few yrs now.  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  Military Service: No ; Biological Fr has Hx in [employer] & died from MVA that tumbled off a hill. All passengers were killed when Pt was 52yo.  Educational History: Education: high school diploma/GED  Religion/Sprituality/World View: Attended 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 She has not returned since the Pandemic.  Any cultural differences that may affect / interfere with treatment:  None noted today  Recreation/Hobbies: Pt loves [employer] & is trying to include more self-care in her life, esp'ly since her Dx of MS.  Stressors: Health problems   Loss of stable sense of self since Dx  Strengths: Supportive Relationships, Family, Spirituality, Hopefulness, Self Advocate, and Able to Communicate Effectively  Barriers:  None noted today   Legal History: Pending legal issue / charges: The patient has no significant history of legal issues. History of legal issue / charges:  NA  Medical History/Surgical History: reviewed Past Medical History:  Diagnosis Date   Borderline diabetes    Hypertension    Multiple sclerosis (HCC)     Past Surgical History:  Procedure Laterality Date   ABDOMINAL HYSTERECTOMY     BACK SURGERY     c section x 2      Medications: Current Outpatient Medications  Medication Sig Dispense Refill   albuterol (VENTOLIN HFA) 108 (90 Base) MCG/ACT inhaler Inhale 1 puff into the lungs as needed.     amLODipine (NORVASC) 10 MG tablet Take 1 tablet by mouth daily.     BLACK CURRANT SEED OIL PO Take 2.5 mLs by mouth daily.     buPROPion (WELLBUTRIN XL) 150 MG 24 hr tablet TAKE 1 TABLET BY MOUTH EVERY DAY 30 tablet 0   cetirizine (ZYRTEC) 10 MG tablet Take 10 mg by mouth as needed.     estradiol (ESTRACE) 2 MG tablet Take 2 mg by mouth daily.     fluticasone (FLONASE) 50 MCG/ACT nasal spray Place 2 sprays into both nostrils as needed.     hydrochlorothiazide (HYDRODIURIL) 25 MG tablet Take 1 tablet (25 mg total) by mouth daily. 90 tablet  3   losartan (COZAAR) 100 MG tablet Take 1 tablet by mouth daily.     metoprolol tartrate (LOPRESSOR) 50 MG tablet Take 50 mg by mouth 2 (two) times daily.     Multiple Vitamin (MULTIVITAMIN) LIQD Take 15 mLs by mouth daily.     predniSONE (DELTASONE) 50 MG tablet 12 pills (600 mg) po every day x 2 days for MS exacerbation 24 tablet 0   Ublituximab-xiiy (BRIUMVI) 150 MG/6ML SOLN Inject into the vein. Day 1: 150mg , Day 15: 150mg  then 450mg  every 6 months thereafter     valACYclovir (VALTREX) 1000 MG tablet Take by mouth.     Vitamin D, Ergocalciferol, (DRISDOL) 1.25 MG (50000 UNIT) CAPS capsule Take 50,000 Units by mouth once  a week.     No current facility-administered medications for this visit.    Allergies  Allergen Reactions   Other Shortness Of Breath, Itching and Swelling    ALL STONE FRUITS    Contrast Media [Iodinated Contrast Media] Itching and Swelling   Morphine And Codeine Itching and Swelling    Diagnoses:  Adjustment disorder with mixed anxiety and depressed mood  Plan of Care: Lowella Bandy wants to participate in psychotherapy to assist her to process life's recent challenges. She will attend all sessions as scheduled every 3-4 wks to facilitate her own personal growth. She will keep a Notebook btwn sessions to record her thoughts, feelings, & any events she wants to process.   Target Date: 12/09/2022  Progress: 4  Frequency: Once every 3-4 wks  Modality: Claretta Fraise, LMFT

## 2022-11-11 ENCOUNTER — Encounter: Payer: Self-pay | Admitting: Neurology

## 2022-11-11 ENCOUNTER — Ambulatory Visit (HOSPITAL_BASED_OUTPATIENT_CLINIC_OR_DEPARTMENT_OTHER)
Admission: RE | Admit: 2022-11-11 | Discharge: 2022-11-11 | Disposition: A | Discharge status: Home / Self Care | Payer: 59 | Source: Ambulatory Visit | Attending: Family | Admitting: Family

## 2022-11-11 ENCOUNTER — Encounter: Payer: Self-pay | Admitting: Family

## 2022-11-11 ENCOUNTER — Ambulatory Visit: Payer: 59 | Admitting: Family

## 2022-11-11 VITALS — BP 138/88 | HR 60 | Ht 68.0 in | Wt 299.6 lb

## 2022-11-11 DIAGNOSIS — R0602 Shortness of breath: Secondary | ICD-10-CM | POA: Diagnosis not present

## 2022-11-11 NOTE — Progress Notes (Signed)
Brooke Ballard is a 52 y.o. female with the following history as recorded in EpicCare:  Patient Active Problem List   Diagnosis Date Noted   Multiple sclerosis (HCC) 05/05/2022   Gait disturbance 05/05/2022   OSA (obstructive sleep apnea) 05/05/2022   Other fatigue 05/05/2022   Balance disorder 05/05/2022   Cluster headache 12/10/2014   Tobacco use disorder 07/18/2014   HTN (hypertension) 03/20/2014    Current Outpatient Medications  Medication Sig Dispense Refill   albuterol (VENTOLIN HFA) 108 (90 Base) MCG/ACT inhaler Inhale 1 puff into the lungs as needed.     amLODipine (NORVASC) 10 MG tablet Take 1 tablet by mouth daily.     BLACK CURRANT SEED OIL PO Take 2.5 mLs by mouth daily.     buPROPion (WELLBUTRIN XL) 150 MG 24 hr tablet TAKE 1 TABLET BY MOUTH EVERY DAY 30 tablet 0   cetirizine (ZYRTEC) 10 MG tablet Take 10 mg by mouth as needed.     estradiol (ESTRACE) 2 MG tablet Take 2 mg by mouth daily.     fluticasone (FLONASE) 50 MCG/ACT nasal spray Place 2 sprays into both nostrils as needed.     hydrochlorothiazide (HYDRODIURIL) 25 MG tablet Take 1 tablet (25 mg total) by mouth daily. 90 tablet 3   losartan (COZAAR) 100 MG tablet Take 1 tablet by mouth daily.     metoprolol tartrate (LOPRESSOR) 50 MG tablet Take 50 mg by mouth 2 (two) times daily.     Multiple Vitamin (MULTIVITAMIN) LIQD Take 15 mLs by mouth daily.     predniSONE (DELTASONE) 50 MG tablet 12 pills (600 mg) po every day x 2 days for MS exacerbation 24 tablet 0   Ublituximab-xiiy (BRIUMVI) 150 MG/6ML SOLN Inject into the vein. Day 1: 150mg , Day 15: 150mg  then 450mg  every 6 months thereafter     valACYclovir (VALTREX) 1000 MG tablet Take by mouth.     Vitamin D, Ergocalciferol, (DRISDOL) 1.25 MG (50000 UNIT) CAPS capsule Take 50,000 Units by mouth once a week.     No current facility-administered medications for this visit.    Allergies: Other, Contrast media [iodinated contrast media], and Morphine and codeine  Past  Medical History:  Diagnosis Date   Borderline diabetes    Hypertension    Multiple sclerosis (HCC)     Past Surgical History:  Procedure Laterality Date   ABDOMINAL HYSTERECTOMY     BACK SURGERY     c section x 2      Family History  Problem Relation Age of Onset   Hypertension Mother    Hypertension Father     Social History   Tobacco Use   Smoking status: Every Day    Current packs/day: 0.50    Types: Cigarettes   Smokeless tobacco: Never   Tobacco comments:    Per patient, in process of cutting back from 1 ppd- down to 1/2 ppd   Substance Use Topics   Alcohol use: No    Comment: socially- wine    Subjective:   Requesting CXR today- has been having increased symptoms with her breathing and wants to rule out source other than her MS due to being a smoker; unfortunately is caught in the middle of insurance complications and has not had treatment for her MS in almost 3 months;  Of note, she was having similar symptoms at time of diagnosis- called MS "hug" and symptoms did improve as her treatments were done; no cough, no recent travel;    Objective:  Vitals:   11/11/22 0950  BP: 138/88  Pulse: 60  SpO2: 98%  Weight: 299 lb 9.6 oz (135.9 kg)  Height: 5\' 8"  (1.727 m)    General: Well developed, well nourished, in no acute distress  Skin : Warm and dry.  Head: Normocephalic and atraumatic  Eyes: Sclera and conjunctiva clear; pupils round and reactive to light; extraocular movements intact  Ears: External normal; canals clear; tympanic membranes normal  Oropharynx: Pink, supple. No suspicious lesions  Neck: Supple without thyromegaly, adenopathy  Lungs: Respirations unlabored; clear to auscultation bilaterally without wheeze, rales, rhonchi  CVS exam: normal rate and regular rhythm.  Neurologic: Alert and oriented; speech intact; face symmetrical; moves all extremities well; CNII-XII intact without focal deficit   Assessment:  1. SOB (shortness of breath)      Plan:  Will check D-dimer and CXR today; she will plan to follow up with her neurologist again today to determine status of home infusion; patient is aware of need to quit smoking;   No follow-ups on file.  Orders Placed This Encounter  Procedures   DG Chest 2 View    Standing Status:   Future    Standing Expiration Date:   11/11/2023    Order Specific Question:   Reason for Exam (SYMPTOM  OR DIAGNOSIS REQUIRED)    Answer:   shortness of breath    Order Specific Question:   Is patient pregnant?    Answer:   No    Order Specific Question:   Preferred imaging location?    Answer:   [facility]   D-Dimer, Quantitative    Requested Prescriptions    No prescriptions requested or ordered in this encounter

## 2022-11-12 LAB — D-DIMER, QUANTITATIVE: D-Dimer, Quant: 0.25 mcg/mL FEU (ref <0.50)

## 2022-11-16 ENCOUNTER — Telehealth: Payer: Self-pay | Admitting: Neurology

## 2022-11-16 NOTE — Telephone Encounter (Signed)
Called Milon Dikes [phone] to work on getting pt approved for [facility]. Faxed Pt's information to him on today 11/16/2022 at [phone].

## 2022-11-16 NOTE — Telephone Encounter (Signed)
Brooke Ballard called from [facility]. Stated they are not able to do Briumvi in the home at this time. Stated she wants to know if Dr. Epimenio Foot is okay with pt coming to office for treatment.

## 2022-11-17 NOTE — Telephone Encounter (Signed)
Dr. Epimenio Foot,  I spoke to infusion staff and they stated that her insurance prevents her from having it done here but from khadijah's last note it looks like we are awaiting determination from another place she sent to.  Thanks,  Production assistant, radio

## 2022-11-20 ENCOUNTER — Other Ambulatory Visit: Payer: Self-pay | Admitting: Family

## 2022-11-22 ENCOUNTER — Telehealth: Payer: Self-pay

## 2022-11-22 NOTE — Telephone Encounter (Signed)
Called pt and let her know that her Home Infusion was denied and that per Dr. Epimenio Foot we can switch her to Ocrevus instead due to the company being able to approve that infusion instead of the Briumvi. Pt agreed to change infusions. Pt also  wants to know if she will have to start over with her infusions since it has been more that 3 months since her last infusion. Told pt we would inform Dr. Epimenio Foot of her question and get back to her.Pt verbalized understanding.

## 2022-11-24 LAB — HM MAMMOGRAPHY

## 2022-11-25 NOTE — Telephone Encounter (Signed)
Please see MyChart message from today.

## 2022-11-30 ENCOUNTER — Ambulatory Visit: Payer: 59 | Admitting: Behavioral Health

## 2022-12-01 ENCOUNTER — Other Ambulatory Visit: Payer: Self-pay | Admitting: Family

## 2022-12-01 MED ORDER — BUPROPION HCL ER (XL) 150 MG PO TB24: 150.0000 mg | ORAL_TABLET | Freq: Every day | ORAL | 1 refills | Status: DC

## 2022-12-02 NOTE — Telephone Encounter (Signed)
  Order faxed to [phone], confirmation received.

## 2022-12-04 ENCOUNTER — Ambulatory Visit
Admission: RE | Admit: 2022-12-04 | Discharge: 2022-12-04 | Disposition: A | Discharge status: Home / Self Care | Payer: 59 | Source: Ambulatory Visit | Attending: Neurology | Admitting: Neurology

## 2022-12-04 DIAGNOSIS — R269 Unspecified abnormalities of gait and mobility: Secondary | ICD-10-CM

## 2022-12-04 DIAGNOSIS — G35 Multiple sclerosis: Secondary | ICD-10-CM

## 2022-12-04 MED ORDER — GADOPICLENOL 0.5 MMOL/ML IV SOLN
10.0000 mL | Freq: Once | INTRAVENOUS | Status: AC | PRN
  Administered 2022-12-04: 10 mL via INTRAVENOUS

## 2022-12-09 ENCOUNTER — Telehealth: Payer: Self-pay | Admitting: Neurology

## 2022-12-09 DIAGNOSIS — G35 Multiple sclerosis: Secondary | ICD-10-CM

## 2022-12-09 DIAGNOSIS — H539 Unspecified visual disturbance: Secondary | ICD-10-CM

## 2022-12-09 NOTE — Telephone Encounter (Signed)
Pt called Would like to go the ophthalmology you recommended, [facility].

## 2022-12-09 NOTE — Telephone Encounter (Signed)
Referral placed for [facility].

## 2022-12-13 ENCOUNTER — Telehealth: Payer: Self-pay | Admitting: Neurology

## 2022-12-13 NOTE — Telephone Encounter (Signed)
Referral for ophthalmology fax to [facility]. Phone:  [phone], Fax: [phone]

## 2022-12-21 ENCOUNTER — Ambulatory Visit (INDEPENDENT_AMBULATORY_CARE_PROVIDER_SITE_OTHER): Payer: 59 | Admitting: Behavioral Health

## 2022-12-21 DIAGNOSIS — F4323 Adjustment disorder with mixed anxiety and depressed mood: Secondary | ICD-10-CM

## 2022-12-21 NOTE — Progress Notes (Signed)
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 Pt understands the risks/limitations of telehealth & consents to Tx today.  Time In: 2:00pm  Time Out: 2:55pm  Treatment Type: Individual Therapy  Reported Symptoms: Elevated anx/dep & stress due to Athletic Season @ work which varies from the [employer] seasonal needs.   Mental Status Exam: Appearance:  Casual     Behavior: Appropriate and Sharing  Motor: Normal  Speech/Language:  Clear and Coherent  Affect: Appropriate  Mood: normal  Thought process: normal  Thought content:   WNL  Sensory/Perceptual disturbances:   WNL  Orientation: oriented to person, place, time/date, and situation  Attention: Good  Concentration: Good  Memory: WNL  Fund of knowledge:  Good  Insight:   Good  Judgment:  Good  Impulse Control: Good   Risk Assessment: Danger to Self:  No Self-injurious Behavior: No Danger to Others: No Duty to Warn:no Physical Aggression / Violence:No  Access to Firearms a concern: No  Gang Involvement:No   Subjective: Pt is doing well today. She is striving to be mindful & grateful for her health issues as it remains positive. She thinks in positive ways & has tried to keep her perspective on walking & dressing herself daily. She can still move her hands to accomplish challenging tasks that are difficult in the home. Her downsizing has helped her cope & she is planning to move to a slightly larger space in the Spring of 2025.  Interventions: Solution-Oriented/Positive Psychology, Psycho-education/Bibliotherapy, and Family Systems  Diagnosis:Adjustment disorder with mixed anxiety and depressed mood  Plan: It is helping Sofie to record her thoughts in a notebook where she sts her worries, concerns, thoughts, &  anxieties. This is a helpful to process her distress. Her close friend gave her a haircut & it took a bit of time to adjust. Her BF has 4 children they are also planning for a new living space to accommodate everyone.  Target Date: 01/24/2023  Progress: 6  Frequency: Once every 2-3 wks  Modality: Claretta Fraise, LMFT

## 2022-12-21 NOTE — Progress Notes (Signed)
                Victoria L Winstead, LMFT 

## 2023-01-01 ENCOUNTER — Telehealth: Payer: Self-pay | Admitting: Neurology

## 2023-01-01 ENCOUNTER — Encounter: Payer: Self-pay | Admitting: Neurology

## 2023-01-01 NOTE — Telephone Encounter (Signed)
See other note

## 2023-01-20 ENCOUNTER — Ambulatory Visit: Payer: 59 | Admitting: Behavioral Health

## 2023-01-20 NOTE — Progress Notes (Unsigned)
                Anastasya Jewell L Farryn Linares, LMFT 

## 2023-02-25 ENCOUNTER — Ambulatory Visit (INDEPENDENT_AMBULATORY_CARE_PROVIDER_SITE_OTHER): Payer: 59 | Admitting: Family

## 2023-02-25 ENCOUNTER — Encounter: Payer: Self-pay | Admitting: Family

## 2023-02-25 VITALS — BP 124/82 | HR 63 | Resp 18 | Ht 68.0 in | Wt 300.2 lb

## 2023-02-25 DIAGNOSIS — R7309 Other abnormal glucose: Secondary | ICD-10-CM | POA: Diagnosis not present

## 2023-02-25 DIAGNOSIS — F4321 Adjustment disorder with depressed mood: Secondary | ICD-10-CM

## 2023-02-25 DIAGNOSIS — I10 Essential (primary) hypertension: Secondary | ICD-10-CM | POA: Diagnosis not present

## 2023-02-25 DIAGNOSIS — J309 Allergic rhinitis, unspecified: Secondary | ICD-10-CM

## 2023-02-25 DIAGNOSIS — Z13220 Encounter for screening for lipoid disorders: Secondary | ICD-10-CM

## 2023-02-25 LAB — CBC WITH DIFFERENTIAL/PLATELET
Basophils Absolute: 0 10*3/uL (ref 0.0–0.1)
Basophils Relative: 0.6 % (ref 0.0–3.0)
Eosinophils Absolute: 0.1 10*3/uL (ref 0.0–0.7)
Eosinophils Relative: 1.2 % (ref 0.0–5.0)
HCT: 43.5 % (ref 36.0–46.0)
Hemoglobin: 14.2 g/dL (ref 12.0–15.0)
Lymphocytes Relative: 30.9 % (ref 12.0–46.0)
Lymphs Abs: 2.5 10*3/uL (ref 0.7–4.0)
MCHC: 32.7 g/dL (ref 30.0–36.0)
MCV: 94.7 fL (ref 78.0–100.0)
Monocytes Absolute: 0.7 10*3/uL (ref 0.1–1.0)
Monocytes Relative: 8.3 % (ref 3.0–12.0)
Neutro Abs: 4.7 10*3/uL (ref 1.4–7.7)
Neutrophils Relative %: 59 % (ref 43.0–77.0)
Platelets: 279 10*3/uL (ref 150.0–400.0)
RBC: 4.6 Mil/uL (ref 3.87–5.11)
RDW: 14 % (ref 11.5–15.5)
WBC: 8 10*3/uL (ref 4.0–10.5)

## 2023-02-25 LAB — LIPID PANEL
Cholesterol: 214 mg/dL — ABNORMAL HIGH (ref 0–200)
HDL: 40 mg/dL (ref >39.00)
LDL Cholesterol: 118 mg/dL — ABNORMAL HIGH (ref 0–99)
NonHDL: 173.65
Total CHOL/HDL Ratio: 5
Triglycerides: 280 mg/dL — ABNORMAL HIGH (ref 0.0–149.0)
VLDL: 56 mg/dL — ABNORMAL HIGH (ref 0.0–40.0)

## 2023-02-25 LAB — COMPREHENSIVE METABOLIC PANEL
ALT: 15 U/L (ref 0–35)
AST: 15 U/L (ref 0–37)
Albumin: 3.8 g/dL (ref 3.5–5.2)
Alkaline Phosphatase: 50 U/L (ref 39–117)
BUN: 10 mg/dL (ref 6–23)
CO2: 26 meq/L (ref 19–32)
Calcium: 9 mg/dL (ref 8.4–10.5)
Chloride: 104 meq/L (ref 96–112)
Creatinine, Ser: 0.85 mg/dL (ref 0.40–1.20)
GFR: 78.54 mL/min (ref >60.00)
Glucose, Bld: 78 mg/dL (ref 70–99)
Potassium: 4.2 meq/L (ref 3.5–5.1)
Sodium: 138 meq/L (ref 135–145)
Total Bilirubin: 0.4 mg/dL (ref 0.2–1.2)
Total Protein: 6.4 g/dL (ref 6.0–8.3)

## 2023-02-25 LAB — HEMOGLOBIN A1C: Hgb A1c MFr Bld: 6.3 % (ref 4.6–6.5)

## 2023-02-25 MED ORDER — BUPROPION HCL ER (XL) 300 MG PO TB24: 300.0000 mg | ORAL_TABLET | Freq: Every day | ORAL | 3 refills | Status: AC

## 2023-02-25 NOTE — Progress Notes (Signed)
Brooke Ballard is a 52 y.o. female with the following history as recorded in EpicCare:  Patient Active Problem List   Diagnosis Date Noted   Multiple sclerosis (HCC) 05/05/2022   Gait disturbance 05/05/2022   OSA (obstructive sleep apnea) 05/05/2022   Other fatigue 05/05/2022   Balance disorder 05/05/2022   Cluster headache 12/10/2014   Tobacco use disorder 07/18/2014   HTN (hypertension) 03/20/2014    Current Outpatient Medications  Medication Sig Dispense Refill   albuterol (VENTOLIN HFA) 108 (90 Base) MCG/ACT inhaler Inhale 1 puff into the lungs as needed.     amLODipine (NORVASC) 10 MG tablet Take 1 tablet by mouth daily.     BLACK CURRANT SEED OIL PO Take 2.5 mLs by mouth daily.     cetirizine (ZYRTEC) 10 MG tablet Take 10 mg by mouth as needed.     estradiol (ESTRACE) 2 MG tablet Take 2 mg by mouth daily.     fluticasone (FLONASE) 50 MCG/ACT nasal spray Place 2 sprays into both nostrils as needed.     hydrochlorothiazide (HYDRODIURIL) 25 MG tablet Take 1 tablet (25 mg total) by mouth daily. 90 tablet 3   losartan (COZAAR) 100 MG tablet Take 1 tablet by mouth daily.     metoprolol tartrate (LOPRESSOR) 50 MG tablet Take 50 mg by mouth 2 (two) times daily.     Multiple Vitamin (MULTIVITAMIN) LIQD Take 15 mLs by mouth daily.     valACYclovir (VALTREX) 1000 MG tablet Take by mouth.     Vitamin D, Ergocalciferol, (DRISDOL) 1.25 MG (50000 UNIT) CAPS capsule Take 50,000 Units by mouth once a week.     buPROPion (WELLBUTRIN XL) 300 MG 24 hr tablet Take 1 tablet (300 mg total) by mouth daily. 90 tablet 3   No current facility-administered medications for this visit.    Allergies: Other, Contrast media [iodinated contrast media], and Morphine and codeine  Past Medical History:  Diagnosis Date   Borderline diabetes    Hypertension    Multiple sclerosis (HCC)     Past Surgical History:  Procedure Laterality Date   ABDOMINAL HYSTERECTOMY     BACK SURGERY     c section x 2       Family History  Problem Relation Age of Onset   Hypertension Mother    Hypertension Father     Social History   Tobacco Use   Smoking status: Every Day    Current packs/day: 0.50    Types: Cigarettes   Smokeless tobacco: Never   Tobacco comments:    Per patient, in process of cutting back from 1 ppd- down to 1/2 ppd   Substance Use Topics   Alcohol use: No    Comment: socially- wine    Subjective:   Follow up on anxiety/ depression- is currently taking Wellbutrin XL 150 mg; had discussed increasing the dosage in the past and does feel that she would like to increase the dosage at this time; had to just complete process to obtain guardianship; in the process of finding a new therapist;  Continuing to work with cardiology; now on Ocrevus for MS- has been able to get infusions done at home;      Objective:  Vitals:   02/25/23 0851  BP: 124/82  Pulse: 63  Resp: 18  SpO2: 98%  Weight: (!) 300 lb 3.2 oz (136.2 kg)  Height: 5\' 8"  (1.727 m)    General: Well developed, well nourished, in no acute distress  Skin : Warm and dry.  Head: Normocephalic and atraumatic  Eyes: Sclera and conjunctiva clear; pupils round and reactive to light; extraocular movements intact  Ears: External normal; canals clear; tympanic membranes normal  Oropharynx: Pink, supple. No suspicious lesions  Neck: Supple without thyromegaly, adenopathy  Lungs: Respirations unlabored; wheezing noted in upper lobes;  CVS exam: normal rate and regular rhythm.  Neurologic: Alert and oriented; speech intact; face symmetrical; moves all extremities well; CNII-XII intact without focal deficit   Assessment:  1. Primary hypertension   2. Lipid screening   3. Elevated glucose   4. Situational depression   5. Allergic rhinitis, unspecified seasonality, unspecified trigger     Plan:  Continue to work with cardiology for management; check CBC, CMP; Check lipid panel; Check Hgba1c; Increase Wellbutrin XL to 300  mg; contact information given for patient look into other options for therapists; Suspect wheezing due to allergies- she does have albuterol and will use for the next 2-3 days; follow up if symptoms persist.   No follow-ups on file.  Orders Placed This Encounter  Procedures   CBC with Differential/Platelet   Comp Met (CMET)   Lipid panel   Hemoglobin A1c    Requested Prescriptions   Signed Prescriptions Disp Refills   buPROPion (WELLBUTRIN XL) 300 MG 24 hr tablet 90 tablet 3    Sig: Take 1 tablet (300 mg total) by mouth daily.

## 2023-02-25 NOTE — Patient Instructions (Signed)
[facility] [address] [place] [address]  [place], [place] [address] [phone]    [facility]  [address] [place], [place] [address] [phone]   [facility]  [address]ella R[address] [place], [place] [address] [phone]   [facility]  [address]  [place], [place] [address] [phone]   [facility]  [address] [place], [place] [address] [phone]   [facility]  [address],  [place], [place] [address] [phone]

## 2023-02-26 ENCOUNTER — Other Ambulatory Visit: Payer: Self-pay | Admitting: Family

## 2023-03-16 ENCOUNTER — Encounter: Payer: Self-pay | Admitting: Family

## 2023-03-17 ENCOUNTER — Encounter: Payer: Self-pay | Admitting: Family

## 2023-03-17 MED ORDER — AMLODIPINE BESYLATE 10 MG PO TABS: 10.0000 mg | ORAL_TABLET | Freq: Every day | ORAL | 1 refills | Status: DC

## 2023-06-07 ENCOUNTER — Encounter: Payer: Self-pay | Admitting: Family

## 2023-06-08 ENCOUNTER — Encounter: Payer: Self-pay | Admitting: Neurology

## 2023-06-16 ENCOUNTER — Encounter: Payer: Self-pay | Admitting: Family

## 2023-06-17 ENCOUNTER — Other Ambulatory Visit: Payer: Self-pay | Admitting: Family

## 2023-06-17 MED ORDER — METOPROLOL TARTRATE 50 MG PO TABS: 50.0000 mg | ORAL_TABLET | Freq: Two times a day (BID) | ORAL | 3 refills | Status: AC

## 2023-06-17 MED ORDER — HYDROCHLOROTHIAZIDE 25 MG PO TABS: 25.0000 mg | ORAL_TABLET | Freq: Every day | ORAL | 3 refills | Status: AC

## 2023-07-01 ENCOUNTER — Ambulatory Visit: Admitting: Family

## 2023-07-05 ENCOUNTER — Ambulatory Visit (INDEPENDENT_AMBULATORY_CARE_PROVIDER_SITE_OTHER): Admitting: Family

## 2023-07-05 ENCOUNTER — Encounter: Payer: Self-pay | Admitting: Family

## 2023-07-05 VITALS — BP 138/80 | HR 71 | Ht 68.0 in | Wt 298.6 lb

## 2023-07-05 DIAGNOSIS — I10 Essential (primary) hypertension: Secondary | ICD-10-CM

## 2023-07-05 DIAGNOSIS — J019 Acute sinusitis, unspecified: Secondary | ICD-10-CM | POA: Diagnosis not present

## 2023-07-05 MED ORDER — FLUTICASONE PROPIONATE 50 MCG/ACT NA SUSP: 2.0000 | NASAL | 6 refills | Status: AC | PRN

## 2023-07-05 MED ORDER — LOSARTAN POTASSIUM 100 MG PO TABS: 100.0000 mg | ORAL_TABLET | Freq: Every day | ORAL | 3 refills | Status: AC

## 2023-07-05 MED ORDER — AMOXICILLIN-POT CLAVULANATE 875-125 MG PO TABS: 1.0000 | ORAL_TABLET | Freq: Two times a day (BID) | ORAL | 0 refills | Status: AC

## 2023-07-05 NOTE — Progress Notes (Signed)
 Brooke Ballard is a 53 y.o. female with the following history as recorded in EpicCare:  Patient Active Problem List   Diagnosis Date Noted   Multiple sclerosis (HCC) 05/05/2022   Gait disturbance 05/05/2022   OSA (obstructive sleep apnea) 05/05/2022   Other fatigue 05/05/2022   Balance disorder 05/05/2022   Cluster headache 12/10/2014   Tobacco use disorder 07/18/2014   HTN (hypertension) 03/20/2014    Current Outpatient Medications  Medication Sig Dispense Refill   albuterol (VENTOLIN HFA) 108 (90 Base) MCG/ACT inhaler Inhale 1 puff into the lungs as needed.     amLODipine (NORVASC) 10 MG tablet Take 1 tablet (10 mg total) by mouth daily. 90 tablet 1   amoxicillin-clavulanate (AUGMENTIN) 875-125 MG tablet Take 1 tablet by mouth 2 (two) times daily for 7 days. 14 tablet 0   BLACK CURRANT SEED OIL PO Take 2.5 mLs by mouth daily.     buPROPion (WELLBUTRIN XL) 300 MG 24 hr tablet Take 1 tablet (300 mg total) by mouth daily. 90 tablet 3   cetirizine (ZYRTEC) 10 MG tablet Take 10 mg by mouth as needed.     estradiol (ESTRACE) 2 MG tablet Take 2 mg by mouth daily.     hydrochlorothiazide (HYDRODIURIL) 25 MG tablet Take 1 tablet (25 mg total) by mouth daily. 90 tablet 3   metoprolol tartrate (LOPRESSOR) 50 MG tablet Take 1 tablet (50 mg total) by mouth 2 (two) times daily. 180 tablet 3   Multiple Vitamin (MULTIVITAMIN) LIQD Take 15 mLs by mouth daily.     valACYclovir (VALTREX) 1000 MG tablet Take by mouth.     Vitamin D, Ergocalciferol, (DRISDOL) 1.25 MG (50000 UNIT) CAPS capsule Take 50,000 Units by mouth once a week.     fluticasone (FLONASE) 50 MCG/ACT nasal spray Place 2 sprays into both nostrils as needed. 16 g 6   losartan (COZAAR) 100 MG tablet Take 1 tablet (100 mg total) by mouth daily. 90 tablet 3   No current facility-administered medications for this visit.    Allergies: Other, Contrast media [iodinated contrast media], and Morphine and codeine  Past Medical History:  Diagnosis  Date   Borderline diabetes    Hypertension    Multiple sclerosis (HCC)     Past Surgical History:  Procedure Laterality Date   ABDOMINAL HYSTERECTOMY     BACK SURGERY     c section x 2      Family History  Problem Relation Age of Onset   Hypertension Mother    Hypertension Father     Social History   Tobacco Use   Smoking status: Every Day    Current packs/day: 0.50    Types: Cigarettes   Smokeless tobacco: Never   Tobacco comments:    Per patient, in process of cutting back from 1 ppd- down to 1/2 ppd   Substance Use Topics   Alcohol use: No    Comment: socially- wine    Subjective:   Concerned about persisting sinus infection; was treated with Augmentin for 5 days at the end of February; did feel better but concerned that did not clear completely;    Objective:  Vitals:   07/05/23 1450  BP: 138/80  Pulse: 71  SpO2: 98%  Weight: 298 lb 9.6 oz (135.4 kg)  Height: 5\' 8"  (1.727 m)    General: Well developed, well nourished, in no acute distress  Skin : Warm and dry.  Head: Normocephalic and atraumatic  Eyes: Sclera and conjunctiva clear; pupils round and  reactive to light; extraocular movements intact  Ears: External normal; canals clear; tympanic membranes congested/ mild erythema left ear Oropharynx: Pink, supple. No suspicious lesions  Neck: Supple without thyromegaly, adenopathy  Lungs: Respirations unlabored; clear to auscultation bilaterally without wheeze, rales, rhonchi  CVS exam: normal rate and regular rhythm.  Neurologic: Alert and oriented; speech intact; face symmetrical; moves all extremities well; CNII-XII intact without focal deficit   Assessment:  1. Acute sinusitis, recurrence not specified, unspecified location   2. Primary hypertension     Plan:  Will extend the Augmentin 875 mg bid x 7 more days; refill updated on Flonase; 2.   Stable; refill updated;    No follow-ups on file.  No orders of the defined types were placed in this  encounter.   Requested Prescriptions   Signed Prescriptions Disp Refills   amoxicillin-clavulanate (AUGMENTIN) 875-125 MG tablet 14 tablet 0    Sig: Take 1 tablet by mouth 2 (two) times daily for 7 days.   losartan (COZAAR) 100 MG tablet 90 tablet 3    Sig: Take 1 tablet (100 mg total) by mouth daily.   fluticasone (FLONASE) 50 MCG/ACT nasal spray 16 g 6    Sig: Place 2 sprays into both nostrils as needed.

## 2023-07-11 ENCOUNTER — Encounter: Payer: Self-pay | Admitting: Neurology

## 2023-07-13 NOTE — Telephone Encounter (Signed)
 Took call from phone room and spoke w/ Lacey/[facility]. Wanting to know if pt can have infusion on 07/18/23. Aware I will send to MD to review and call back tomorrow.  Call back phone #: [phone]. Will be in after 9am tomorrow.

## 2023-07-13 NOTE — Telephone Encounter (Signed)
 Called and spoke w/ Corey/[facility]. Relayed ok for pt to receive infusion 07/18/23. He will let Wylene Men know. Nothing further needed.

## 2023-07-26 ENCOUNTER — Ambulatory Visit: Admitting: Neurology

## 2023-07-26 ENCOUNTER — Encounter: Payer: Self-pay | Admitting: Neurology

## 2023-07-26 VITALS — BP 129/85 | HR 73 | Ht 68.5 in | Wt 296.5 lb

## 2023-07-26 DIAGNOSIS — H539 Unspecified visual disturbance: Secondary | ICD-10-CM | POA: Diagnosis not present

## 2023-07-26 DIAGNOSIS — G4733 Obstructive sleep apnea (adult) (pediatric): Secondary | ICD-10-CM

## 2023-07-26 DIAGNOSIS — G35 Multiple sclerosis: Secondary | ICD-10-CM | POA: Diagnosis not present

## 2023-07-26 DIAGNOSIS — G35D Multiple sclerosis, unspecified: Secondary | ICD-10-CM

## 2023-07-26 DIAGNOSIS — Z79899 Other long term (current) drug therapy: Secondary | ICD-10-CM | POA: Diagnosis not present

## 2023-07-26 DIAGNOSIS — R5383 Other fatigue: Secondary | ICD-10-CM

## 2023-07-26 DIAGNOSIS — G35C1 Active secondary progressive multiple sclerosis: Secondary | ICD-10-CM

## 2023-07-26 DIAGNOSIS — R269 Unspecified abnormalities of gait and mobility: Secondary | ICD-10-CM

## 2023-07-26 NOTE — Progress Notes (Addendum)
 [facility]  PATIENT: Brooke Ballard DOB: 04-Sep-1970  REFERRING DOCTOR OR PCP: Verla Reus, PA (PCP); Garnette Pique [facility] neurology) SOURCE: Patient,  _________________________________   HISTORICAL  CHIEF COMPLAINT:  Chief Complaint  Patient presents with   Follow-up    Pt in room 10. Alone. Here for MS follow up. DMT: Ocrevus Needs labs. Pt reports doing okay. Pt reports brain fog, pt balance is off, no falls. Pt is not sure if brain fog is menopause related.     Brooke Ballard is a 53 y.o. woman with active secondary progressive Multiple Sclerosis  Update 07/26/2023 She switched to  Briumvi with her first infusions in October/November 2023.    She tolerated the infusions well (just mild HA).   She had her last Briumvi infusion 3/24/205.  She did experience a mild infusion reaction with more fatigue and heada heaviness,     She has not had any exacerbations or major new neurologic symptoms.  She has a reduced gait and uses a cane.  She feels balance is mildly worse.   She veers some and has had stumbes but no falls.    She does worse later in the day in general.     When well rested could do > 1/4 mile.     The right side is weakness    She denies spasticity.   Shehas dysesthesias in the  chest (midline) and into legs, L>R.   Bladder is fine.   She notes blurry vision in the late afternoon/evening.   Color vision is fine.      She notes mild cognitive issues and notes some word finding difficulty and stuttering.   She has OSA (moderate with AHI = 18.5 on HST 03/30/2022) and we ordered CPAP bur she has not started.  She would like to hold off and we discussed weight loss as well.  She falls asleep easily.   EDS is a little better.    She has discussed the GLP-1 drugs with her PCP.   She is prediabetic.     She notes some difficulty with driving due to right leg symptoms.       Vascular risks:   Hypercholesterolemia, benign essential hypertension, borderline  DM  No FH of MS or autoimmune disease.   Her mom has fibromyalgia.     MS History She had the onset of  reduced balance in November 2022. A month later, she had weakness on her right side   She had a lot of fatigue.  Symptoms have persisted. She was experiencing more issues with head congestion and not feeling right and an MRI was ordered.  It showed changes concerning for MS and she was referred to Dr. Pique [facility] neurology) who noted right-sided motor symptoms and dysmetria.    The MRI showed multiple T2/FLAIR hyperintense foci including one of the left middle cerebellar peduncle.  Several of the periventricular foci enhanced after contrast.   She tried Tecfidera  but had tolerability issues.    She switched to Briumvi 03/2022.      Imaging: MRI of the cervical and thoracic spine 01/06/2022 showed a normal spinal cord.  There was mild degenerative change at C4-C5.  A focus was seen in the pons that is nonspecific.  The sella turcica is mildly enlarged.  MRI of the brain 11/30/2021 showed multiple T2/FLAIR hyperintense lesions in the periventricular/subcortical white matter,  and one in left middle cerebellar peduncle.  Periventricular foci were oriented perpendicular to the ventricle.  Three of  the periventricular foci demonstrated abnormal enhancement consistent with acute demyelination.  MRI brain 12/04/2022 showed no new lesions  Labs: CSF 12/14/2021 showed 7 oligoclonal bands in the CSF not present in the serum.  The IgG index was elevated at 1.7  Labs 12/10/2021: Vitamin D was low at 21.  Vitamin B12 was normal.  Labs 09/25/2021: Hemoglobin A1c is prediabetic at 6.2.  Total cholesterol was elevated at 221 and LDL at 128  REVIEW OF SYSTEMS: Constitutional: No fevers, chills, sweats, or change in appetite Eyes: No visual changes, double vision, eye pain Ear, nose and throat: No hearing loss, ear pain, nasal congestion, sore throat Cardiovascular: No chest pain, palpitations Respiratory:   No shortness of breath at rest or with exertion.   No wheezes GastrointestinaI: No nausea, vomiting, diarrhea, abdominal pain, fecal incontinence Genitourinary:  No dysuria, urinary retention or frequency.  No nocturia. Musculoskeletal:  No neck pain, back pain Integumentary: No rash, pruritus, skin lesions Neurological: as above Psychiatric: No depression at this time.  No anxiety Endocrine: No palpitations, diaphoresis, change in appetite, change in weigh or increased thirst Hematologic/Lymphatic:  No anemia, purpura, petechiae. Allergic/Immunologic: No itchy/runny eyes, nasal congestion, recent allergic reactions, rashes  ALLERGIES: Allergies  Allergen Reactions   Other Shortness Of Breath, Itching and Swelling    ALL STONE FRUITS    Contrast Media [Iodinated Contrast Media] Itching and Swelling   Morphine And Codeine Itching and Swelling    HOME MEDICATIONS:  Current Outpatient Medications:    albuterol  (VENTOLIN  HFA) 108 (90 Base) MCG/ACT inhaler, Inhale 1 puff into the lungs as needed., Disp: , Rfl:    amLODipine  (NORVASC ) 10 MG tablet, Take 1 tablet (10 mg total) by mouth daily., Disp: 90 tablet, Rfl: 1   BLACK CURRANT SEED OIL PO, Take 2.5 mLs by mouth daily., Disp: , Rfl:    buPROPion  (WELLBUTRIN  XL) 300 MG 24 hr tablet, Take 1 tablet (300 mg total) by mouth daily., Disp: 90 tablet, Rfl: 3   cetirizine (ZYRTEC) 10 MG tablet, Take 10 mg by mouth as needed., Disp: , Rfl:    estradiol  (ESTRACE ) 2 MG tablet, Take 2 mg by mouth daily., Disp: , Rfl:    fluticasone  (FLONASE ) 50 MCG/ACT nasal spray, Place 2 sprays into both nostrils as needed., Disp: 16 g, Rfl: 6   hydrochlorothiazide  (HYDRODIURIL ) 25 MG tablet, Take 1 tablet (25 mg total) by mouth daily., Disp: 90 tablet, Rfl: 3   losartan  (COZAAR ) 100 MG tablet, Take 1 tablet (100 mg total) by mouth daily., Disp: 90 tablet, Rfl: 3   metoprolol  tartrate (LOPRESSOR ) 50 MG tablet, Take 1 tablet (50 mg total) by mouth 2 (two) times  daily., Disp: 180 tablet, Rfl: 3   Multiple Vitamin (MULTIVITAMIN) LIQD, Take 15 mLs by mouth daily., Disp: , Rfl:    valACYclovir (VALTREX) 1000 MG tablet, Take by mouth., Disp: , Rfl:    Vitamin D, Ergocalciferol, (DRISDOL) 1.25 MG (50000 UNIT) CAPS capsule, Take 50,000 Units by mouth once a week., Disp: , Rfl:   PAST MEDICAL HISTORY: Past Medical History:  Diagnosis Date   Borderline diabetes    Hypertension    Multiple sclerosis (HCC)     PAST SURGICAL HISTORY: Past Surgical History:  Procedure Laterality Date   ABDOMINAL HYSTERECTOMY     BACK SURGERY     c section x 2      FAMILY HISTORY: Family History  Problem Relation Age of Onset   Hypertension Mother    Hypertension Father  SOCIAL HISTORY: Social History   Socioeconomic History   Marital status: Divorced    Spouse name: Not on file   Number of children: Not on file   Years of education: Not on file   Highest education level: Some college, no degree  Occupational History   Not on file  Tobacco Use   Smoking status: Every Day    Current packs/day: 0.50    Types: Cigarettes   Smokeless tobacco: Never   Tobacco comments:    Per patient, in process of cutting back from 1 ppd- down to 1/2 ppd   Substance and Sexual Activity   Alcohol use: No    Comment: socially- wine   Drug use: No   Sexual activity: Not on file  Other Topics Concern   Not on file  Social History Narrative   Right handed    16 oz coffee per day   Lives with child   Social Drivers of Health   Financial Resource Strain: Medium Risk (12/06/2022)   Received from [facility]   Overall Financial Resource Strain (CARDIA)    Difficulty of Paying Living Expenses: Somewhat hard  Food Insecurity: Food Insecurity Present (12/06/2022)   Received from [facility]   Hunger Vital Sign    Worried About Running Out of Food in the Last Year: Patient declined    Ran Out of Food in the Last Year: Sometimes true  Transportation Needs: No  Transportation Needs (12/06/2022)   Received from [facility] - Transportation    Lack of Transportation (Medical): No    Lack of Transportation (Non-Medical): No  Physical Activity: Unknown (12/06/2022)   Received from [facility]   Exercise Vital Sign    Days of Exercise per Week: 0 days    Minutes of Exercise per Session: Not on file  Recent Concern: Physical Activity - Insufficiently Active (09/07/2022)   Exercise Vital Sign    Days of Exercise per Week: 1 day    Minutes of Exercise per Session: 10 min  Stress: No Stress Concern Present (12/06/2022)   Received from [facility] of Occupational Health - Occupational Stress Questionnaire    Feeling of Stress : Only a little  Recent Concern: Stress - Stress Concern Present (09/07/2022)   [employer] of Occupational Health - Occupational Stress Questionnaire    Feeling of Stress : Rather much  Social Connections: Moderately Integrated (12/06/2022)   Received from [facility]   Social Network    How would you rate your social network (family, work, friends)?: Adequate participation with social networks  Recent Concern: Social Connections - Moderately Isolated (09/07/2022)   Social Connection and Isolation Panel [NHANES]    Frequency of Communication with Friends and Family: More than three times a week    Frequency of Social Gatherings with Friends and Family: Once a week    Attends Religious Services: 1 to 4 times per year    Active Member of [employer] or Organizations: No    Attends Banker Meetings: Not on file    Marital Status: Widowed  Intimate Partner Violence: Not At Risk (12/06/2022)   Received from [facility]   HITS    Over the last 12 months how often did your partner physically hurt you?: Never    Over the last 12 months how often did your partner insult you or talk down to you?: Never    Over the last 12 months how often did your partner threaten  you with physical harm?:  Never    Over the last 12 months how often did your partner scream or curse at you?: Never       PHYSICAL EXAM  Vitals:   07/26/23 1353  BP: 129/85  Pulse: 73  Weight: 296 lb 8 oz (134.5 kg)  Height: 5' 8.5 (1.74 m)     Body mass index is 44.43 kg/m.  VA 20/30 OS; 20/30 OD  General: The patient is well-developed and well-nourished and in no acute distress.    HEENT:  Head is [place]/AT.  Sclera are anicteric  Skin: Extremities are without rash or  edema.  Neurologic Exam  Mental status: The patient is alert and oriented x 3 at the time of the examination. The patient has apparent normal recent and remote memory, with an apparently normal attention span and concentration ability.   Speech is normal.  Cranial nerves: Extraocular movements are full. Slight reduced acuity OD vs OS.   She reports symmetric touch sensation and temperature sensation in the right face relative to the left.Facial strength is normal.  Trapezius and sternocleidomastoid strength is normal. No dysarthria is noted.  No obvious hearing deficits are noted.  Motor:  Muscle bulk is normal.   Tone is normal. Strength is  5 / 5 in all 4 extremities.   Sensory: She now has symmetric touch and temperature and vibration in limbs.   Coordination: Cerebellar testing reveals good finger-nose-finger and mildly reduced right heel-to-shin .  Gait and station: Station is normal.  Her gait is mildly wide.  Tandem gait is wide.. Romberg negative  Reflexes: Deep tendon reflexes are symmetric and normal bilaterally.       DIAGNOSTIC DATA (LABS, IMAGING, TESTING) - I reviewed patient records, labs, notes, testing and imaging myself where available.  Lab Results  Component Value Date   WBC 8.0 02/25/2023   HGB 14.2 02/25/2023   HCT 43.5 02/25/2023   MCV 94.7 02/25/2023   PLT 279.0 02/25/2023      Component Value Date/Time   NA 138 02/25/2023 0925   K 4.2 02/25/2023 0925   CL 104 02/25/2023 0925   CO2 26  02/25/2023 0925   GLUCOSE 78 02/25/2023 0925   BUN 10 02/25/2023 0925   CREATININE 0.85 02/25/2023 0925   CREATININE 0.76 07/18/2014 1025   CALCIUM 9.0 02/25/2023 0925   PROT 6.4 02/25/2023 0925   ALBUMIN 3.8 02/25/2023 0925   AST 15 02/25/2023 0925   ALT 15 02/25/2023 0925   ALKPHOS 50 02/25/2023 0925   BILITOT 0.4 02/25/2023 0925   GFRNONAA >60 02/02/2015 2053   GFRNONAA >89 07/18/2014 1025   GFRAA >60 02/02/2015 2053   GFRAA >89 07/18/2014 1025   Lab Results  Component Value Date   CHOL 214 (H) 02/25/2023   HDL 40.00 02/25/2023   LDLCALC 118 (H) 02/25/2023   TRIG 280.0 (H) 02/25/2023   CHOLHDL 5 02/25/2023   Lab Results  Component Value Date   HGBA1C 6.3 02/25/2023        ASSESSMENT AND PLAN  Multiple sclerosis (HCC) - Plan: IgG, IgA, IgM, CBC with Differential/Platelet  High risk medication use - Plan: IgG, IgA, IgM, CBC with Differential/Platelet  OSA (obstructive sleep apnea)  Vision disturbance  Gait disturbance  Other fatigue  Continue Ocrevus.   Check labs.   . Stay active and exercise as tolerated.  We discussed that weight loss could be helpful for her general health and possibly for the MS. Continue vitamin D supplements. She  has OSA but never got her CPAP.  I encouraged her to follow through.    Return in 6 months or sooner if there are new or worsening neurologic symptoms.  Toshiko Kemler A. Vear, MD, [facility] 07/26/2023, 2:29 PM Certified in Neurology, Clinical Neurophysiology, Sleep Medicine and Neuroimaging  [facility] [address], [address] [place], [place] [address] [phone]

## 2023-07-27 LAB — IGG, IGA, IGM
IgA/Immunoglobulin A, Serum: 272 mg/dL (ref 87–352)
IgG (Immunoglobin G), Serum: 1108 mg/dL (ref 586–1602)
IgM (Immunoglobulin M), Srm: 23 mg/dL — ABNORMAL LOW (ref 26–217)

## 2023-07-27 LAB — CBC WITH DIFFERENTIAL/PLATELET
Basophils Absolute: 0.1 10*3/uL (ref 0.0–0.2)
Basos: 1 %
EOS (ABSOLUTE): 0.1 10*3/uL (ref 0.0–0.4)
Eos: 1 %
Hematocrit: 43.8 % (ref 34.0–46.6)
Hemoglobin: 14.8 g/dL (ref 11.1–15.9)
Immature Grans (Abs): 0.1 10*3/uL (ref 0.0–0.1)
Immature Granulocytes: 1 %
Lymphocytes Absolute: 3.2 10*3/uL — ABNORMAL HIGH (ref 0.7–3.1)
Lymphs: 34 %
MCH: 31.8 pg (ref 26.6–33.0)
MCHC: 33.8 g/dL (ref 31.5–35.7)
MCV: 94 fL (ref 79–97)
Monocytes Absolute: 0.7 10*3/uL (ref 0.1–0.9)
Monocytes: 8 %
Neutrophils Absolute: 5.1 10*3/uL (ref 1.4–7.0)
Neutrophils: 55 %
Platelets: 293 10*3/uL (ref 150–450)
RBC: 4.66 x10E6/uL (ref 3.77–5.28)
RDW: 13.5 % (ref 11.7–15.4)
WBC: 9.3 10*3/uL (ref 3.4–10.8)

## 2023-07-28 ENCOUNTER — Encounter: Payer: Self-pay | Admitting: Neurology

## 2023-07-29 ENCOUNTER — Other Ambulatory Visit: Payer: Self-pay | Admitting: Family

## 2023-07-29 ENCOUNTER — Encounter: Payer: Self-pay | Admitting: Family

## 2023-07-29 ENCOUNTER — Ambulatory Visit: Admitting: Family

## 2023-07-29 ENCOUNTER — Ambulatory Visit (HOSPITAL_BASED_OUTPATIENT_CLINIC_OR_DEPARTMENT_OTHER)
Admission: RE | Admit: 2023-07-29 | Discharge: 2023-07-29 | Disposition: A | Discharge status: Home / Self Care | Source: Ambulatory Visit | Attending: Family | Admitting: Family

## 2023-07-29 VITALS — BP 122/84 | HR 82 | Ht 68.5 in | Wt 293.0 lb

## 2023-07-29 DIAGNOSIS — R0789 Other chest pain: Secondary | ICD-10-CM | POA: Diagnosis not present

## 2023-07-29 DIAGNOSIS — M79602 Pain in left arm: Secondary | ICD-10-CM | POA: Insufficient documentation

## 2023-07-29 DIAGNOSIS — M25512 Pain in left shoulder: Secondary | ICD-10-CM | POA: Diagnosis present

## 2023-07-29 DIAGNOSIS — R935 Abnormal findings on diagnostic imaging of other abdominal regions, including retroperitoneum: Secondary | ICD-10-CM

## 2023-07-29 MED ORDER — ESTRADIOL 1 MG PO TABS: 1.0000 mg | ORAL_TABLET | Freq: Every day | ORAL | 0 refills | Status: DC

## 2023-07-29 NOTE — Progress Notes (Signed)
 Brooke Ballard is a 53 y.o. female with the following history as recorded in EpicCare:  Patient Active Problem List   Diagnosis Date Noted   Multiple sclerosis (HCC) 05/05/2022   Gait disturbance 05/05/2022   OSA (obstructive sleep apnea) 05/05/2022   Other fatigue 05/05/2022   Balance disorder 05/05/2022   Cluster headache 12/10/2014   Tobacco use disorder 07/18/2014   HTN (hypertension) 03/20/2014    Current Outpatient Medications  Medication Sig Dispense Refill   albuterol (VENTOLIN HFA) 108 (90 Base) MCG/ACT inhaler Inhale 1 puff into the lungs as needed.     amLODipine (NORVASC) 10 MG tablet Take 1 tablet (10 mg total) by mouth daily. 90 tablet 1   BLACK CURRANT SEED OIL PO Take 2.5 mLs by mouth daily.     buPROPion (WELLBUTRIN XL) 300 MG 24 hr tablet Take 1 tablet (300 mg total) by mouth daily. 90 tablet 3   cetirizine (ZYRTEC) 10 MG tablet Take 10 mg by mouth as needed.     estradiol (ESTRACE) 1 MG tablet Take 1 tablet (1 mg total) by mouth daily. 30 tablet 0   fluticasone (FLONASE) 50 MCG/ACT nasal spray Place 2 sprays into both nostrils as needed. 16 g 6   hydrochlorothiazide (HYDRODIURIL) 25 MG tablet Take 1 tablet (25 mg total) by mouth daily. 90 tablet 3   losartan (COZAAR) 100 MG tablet Take 1 tablet (100 mg total) by mouth daily. 90 tablet 3   metoprolol tartrate (LOPRESSOR) 50 MG tablet Take 1 tablet (50 mg total) by mouth 2 (two) times daily. 180 tablet 3   Multiple Vitamin (MULTIVITAMIN) LIQD Take 15 mLs by mouth daily.     valACYclovir (VALTREX) 1000 MG tablet Take by mouth.     Vitamin D, Ergocalciferol, (DRISDOL) 1.25 MG (50000 UNIT) CAPS capsule Take 50,000 Units by mouth once a week.     No current facility-administered medications for this visit.    Allergies: Other, Contrast media [iodinated contrast media], and Morphine and codeine  Past Medical History:  Diagnosis Date   Borderline diabetes    Hypertension    Multiple sclerosis (HCC)     Past Surgical  History:  Procedure Laterality Date   ABDOMINAL HYSTERECTOMY     BACK SURGERY     c section x 2      Family History  Problem Relation Age of Onset   Hypertension Mother    Hypertension Father     Social History   Tobacco Use   Smoking status: Every Day    Current packs/day: 0.50    Types: Cigarettes   Smokeless tobacco: Never   Tobacco comments:    Per patient, in process of cutting back from 1 ppd- down to 1/2 ppd   Substance Use Topics   Alcohol use: No    Comment: socially- wine    Subjective:    RUQ pain x 2-3 weeks- intermittent pain; no nausea/ vomiting; not always related to food; pain has not changed in intensity- "dull aggravating pain." No fever, no changes in bowel movements;  Left upper arm pain x 2- 3 week; intermittent pain as well; feels like arm more noticeable than abdominal pain; no numbness/ tingling; no swelling; no injury;  Thinks symptoms may have started after MS infusion- first time able to take full dosage of MS infusion/ admits that "it was hard." Did get the infusion in the left arm;   Off Wellbutrin and estrogen x 2-3 weeks;     Objective:  Vitals:  07/29/23 1515  BP: 122/84  Pulse: 82  SpO2: 98%  Weight: 293 lb (132.9 kg)  Height: 5' 8.5" (1.74 m)    General: Well developed, well nourished, in no acute distress  Skin : Warm and dry.  Head: Normocephalic and atraumatic  Eyes: Sclera and conjunctiva clear; pupils round and reactive to light; extraocular movements intact  Ears: External normal; canals clear; tympanic membranes normal  Oropharynx: Pink, supple. No suspicious lesions  Neck: Supple without thyromegaly, adenopathy  Lungs: Respirations unlabored; clear to auscultation bilaterally without wheeze, rales, rhonchi  CVS exam: normal rate and regular rhythm.  Abdomen: Soft; nontender; nondistended; normoactive bowel sounds; no masses or hepatosplenomegaly  Musculoskeletal: No deformities; no active joint inflammation  Neurologic:  Alert and oriented; speech intact; face symmetrical; uses walker;   Assessment:  1. Abdominal ultrasound, abnormal   2. Atypical chest pain   3. Acute pain of left shoulder   4. Left arm pain     Plan:  ? Reaction to MS infusion vs sudden stopping of Wellbutrin and estrogen; update abdominal ultrasound;  Update EKG- NSR;  Update left shoulder and left humerus X-rays; Discussed cardiology recommendation and in agreement to go ahead and taper off her Estrace; will lower her dosage to 1 mg #30 tablets given and she is to follow up with her GYN;   No follow-ups on file.  Orders Placed This Encounter  Procedures   US Abdomen Complete    Standing Status:   Future    Expiration Date:   07/28/2024    Reason for Exam (SYMPTOM  OR DIAGNOSIS REQUIRED):   ruq pain    Preferred imaging location?:   [facility]   DG Shoulder Left    Standing Status:   Future    Number of Occurrences:   1    Expiration Date:   01/28/2024    Reason for Exam (SYMPTOM  OR DIAGNOSIS REQUIRED):   left shoulder pain    Is the patient pregnant?:   No    Preferred imaging location?:   [facility]   DG Humerus Left    Standing Status:   Future    Number of Occurrences:   1    Expiration Date:   01/28/2024    Reason for Exam (SYMPTOM  OR DIAGNOSIS REQUIRED):   left arm pain    Is patient pregnant?:   No    Preferred imaging location?:   [facility]   EKG 12-Lead    Requested Prescriptions   Signed Prescriptions Disp Refills   estradiol (ESTRACE) 1 MG tablet 30 tablet 0    Sig: Take 1 tablet (1 mg total) by mouth daily.

## 2023-07-29 NOTE — Patient Instructions (Addendum)
 Your cardiologist wanted you to discuss tapering off your estrogen with your GYN. I do also think this is a good plan. Since you have been off the estrogen for a few weeks, this could be causing some of your symptoms. I am going to decrease your dosage and send in the 1 mg Estrace and then please talk to your GYN about long term management and tapering off completely.   You are also overdue to see your cardiologist; please schedule that follow up;   You will be contacted to schedule your abdominal ultrasound.

## 2023-08-01 ENCOUNTER — Encounter: Payer: Self-pay | Admitting: Family

## 2023-08-07 ENCOUNTER — Ambulatory Visit (HOSPITAL_BASED_OUTPATIENT_CLINIC_OR_DEPARTMENT_OTHER)
Admission: RE | Admit: 2023-08-07 | Discharge: 2023-08-07 | Disposition: A | Discharge status: Home / Self Care | Source: Ambulatory Visit | Attending: Family | Admitting: Family

## 2023-08-07 DIAGNOSIS — R935 Abnormal findings on diagnostic imaging of other abdominal regions, including retroperitoneum: Secondary | ICD-10-CM | POA: Diagnosis present

## 2023-08-08 ENCOUNTER — Encounter: Payer: Self-pay | Admitting: Family

## 2023-08-16 ENCOUNTER — Telehealth: Payer: Self-pay | Admitting: *Deleted

## 2023-08-16 NOTE — Telephone Encounter (Signed)
 Received nursing assessment update from pt Ocrevus infusion 07/18/23. Listed therapy as every 8 wk, not every 6 months. I called [facility] at  [phone]. Spoke w/ [facility] who transferred me to Lacey/pharmacist. She will have them update note to correct frequency. Verified pt is receiving q 6 months.  Next infusion: 01/09/24. MD ok'd this. As long as pt is receiving q 26 wk.   Per notes: pt tolerated infusion well without problems.

## 2023-09-16 ENCOUNTER — Other Ambulatory Visit: Payer: Self-pay | Admitting: Family

## 2023-09-27 ENCOUNTER — Ambulatory Visit (INDEPENDENT_AMBULATORY_CARE_PROVIDER_SITE_OTHER): Admitting: Physician Assistant

## 2023-09-27 ENCOUNTER — Encounter: Payer: Self-pay | Admitting: Physician Assistant

## 2023-09-27 ENCOUNTER — Ambulatory Visit: Payer: Self-pay | Admitting: *Deleted

## 2023-09-27 VITALS — BP 135/84 | HR 71 | Ht 68.5 in | Wt 294.6 lb

## 2023-09-27 DIAGNOSIS — N6002 Solitary cyst of left breast: Secondary | ICD-10-CM | POA: Diagnosis not present

## 2023-09-27 DIAGNOSIS — L0291 Cutaneous abscess, unspecified: Secondary | ICD-10-CM

## 2023-09-27 MED ORDER — DOXYCYCLINE HYCLATE 100 MG PO TABS: 100.0000 mg | ORAL_TABLET | Freq: Two times a day (BID) | ORAL | 0 refills | Status: AC

## 2023-09-27 NOTE — Progress Notes (Signed)
 Established patient visit   Patient: Brooke Ballard   DOB: February 24, 1971   53 y.o. Female  MRN: [id number] Visit Date: 09/27/2023  Today's healthcare provider: Trenton Frock, PA-C   Cc. Left breast abscess  Subjective     Pt reports a boil under left breast x 1 week. Reports it ruptured as she got changed for the exam . H/o of recurrent abscess at this spot.   Medications: Outpatient Medications Prior to Visit  Medication Sig   albuterol (VENTOLIN HFA) 108 (90 Base) MCG/ACT inhaler Inhale 1 puff into the lungs as needed.   amLODipine  (NORVASC ) 10 MG tablet Take 1 tablet (10 mg total) by mouth daily.   BLACK CURRANT SEED OIL PO Take 2.5 mLs by mouth daily.   buPROPion  (WELLBUTRIN  XL) 300 MG 24 hr tablet Take 1 tablet (300 mg total) by mouth daily.   cetirizine (ZYRTEC) 10 MG tablet Take 10 mg by mouth as needed.   estradiol  (ESTRACE ) 1 MG tablet Take 1 tablet (1 mg total) by mouth daily.   fluticasone  (FLONASE ) 50 MCG/ACT nasal spray Place 2 sprays into both nostrils as needed.   hydrochlorothiazide  (HYDRODIURIL ) 25 MG tablet Take 1 tablet (25 mg total) by mouth daily.   losartan  (COZAAR ) 100 MG tablet Take 1 tablet (100 mg total) by mouth daily.   metoprolol  tartrate (LOPRESSOR ) 50 MG tablet Take 1 tablet (50 mg total) by mouth 2 (two) times daily.   Multiple Vitamin (MULTIVITAMIN) LIQD Take 15 mLs by mouth daily.   valACYclovir (VALTREX) 1000 MG tablet Take by mouth.   Vitamin D, Ergocalciferol, (DRISDOL) 1.25 MG (50000 UNIT) CAPS capsule Take 50,000 Units by mouth once a week.   No facility-administered medications prior to visit.    Review of Systems  Constitutional:  Negative for fatigue and fever.  Respiratory:  Negative for cough and shortness of breath.   Cardiovascular:  Negative for chest pain and leg swelling.  Gastrointestinal:  Negative for abdominal pain.  Skin:  Positive for color change and wound.  Neurological:  Negative for dizziness and headaches.        Objective    BP 135/84   Pulse 71   Ht 5' 8.5" (1.74 m)   Wt 294 lb 9.6 oz (133.6 kg)   LMP 07/09/2012   BMI 44.14 kg/m    Physical Exam Vitals reviewed.  Constitutional:      Appearance: She is not ill-appearing.  HENT:     Head: Normocephalic.  Eyes:     Conjunctiva/sclera: Conjunctivae normal.  Cardiovascular:     Rate and Rhythm: Normal rate.  Pulmonary:     Effort: Pulmonary effort is normal. No respiratory distress.  Skin:    Comments: Under left breast there is a 3-4 cm fluctuant mass draining thick green/yellow pus. Minimal bleeding.  Surrounding area is firm.    Neurological:     Mental Status: She is alert and oriented to person, place, and time.  Psychiatric:        Mood and Affect: Mood normal.        Behavior: Behavior normal.     No results found for any visits on 09/27/23.  Assessment & Plan    Abscess -     Doxycycline  Hyclate; Take 1 tablet (100 mg total) by mouth 2 (two) times daily for 7 days.  Dispense: 14 tablet; Refill: 0  Cyst of left breast -     Ambulatory referral to General Surgery   No I&D needed  as already opened and draining copious purulent material. Once drained, area of fluctuance now ~1 cm.  Area encouraged to drain, cleaned and dressed.  Recommending warm compresses, keeping the area clean, dry.  Rx doxy bid x 7 days.  Recommending surgical consult for removal once healed from current infection.  Return if symptoms worsen or fail to improve.       Trenton Frock, PA-C  [facility] at [facility] [phone] (phone) [phone] (fax)  [facility]

## 2023-09-27 NOTE — Telephone Encounter (Signed)
   Chief Complaint: abscess reoccurrence  Symptoms: painful lump under left breast- red irritated,swollen Frequency: started last week Pertinent Negatives: Patient denies fever Disposition: [] ED /[] Urgent Care (no appt availability in office) / [x] Appointment(In office/virtual)/ []  [facility]/ [] Home Care/ [] Refused Recommended Disposition /[] [facility]/ []  Follow-up with PCP   Copied from CRM [phone]. Topic: Clinical - Red Word Triage >> Sep 27, 2023 12:25 PM Jethro Morrison wrote: [facility] that prompted transfer to Nurse Triage:PT STATED SHE HAS A ABSCESS UNDER HER BREAST AND IT IS PAINFUL STATED IT IS A LITTLE WARM AND VERY SORE AND HARD Reason for Disposition  SEVERE pain (e.g., excruciating)  Answer Assessment - Initial Assessment Questions 1. APPEARANCE of BOIL: "What does the boil look like?"      Red, filled with fluid, firm 2. LOCATION: "Where is the boil located?"      Under Left breast 3. NUMBER: "How many boils are there?"      one 4. SIZE: "How big is the boil?" (e.g., inches, cm; compare to size of a coin or other object)     Dime to 50 cent piece  5. ONSET: "When did the boil start?"     Started last week 6. PAIN: "Is there any pain?" If Yes, ask: "How bad is the pain?"   (Scale 1-10; or mild, moderate, severe)     9/10 7. FEVER: "Do you have a fever?" If Yes, ask: "What is it, how was it measured, and when did it start?"      no 8. SOURCE: "Have you been around anyone with boils or other Staph infections?" "Have you ever had boils before?"     Reoccurring  Protocols used: Boil (Skin Abscess)-A-AH

## 2023-11-08 ENCOUNTER — Encounter: Payer: Self-pay | Admitting: Family

## 2023-11-08 MED ORDER — ALBUTEROL SULFATE HFA 108 (90 BASE) MCG/ACT IN AERS: 1.0000 | INHALATION_SPRAY | Freq: Four times a day (QID) | RESPIRATORY_TRACT | 5 refills | Status: AC | PRN

## 2023-12-01 ENCOUNTER — Telehealth: Payer: Self-pay | Admitting: *Deleted

## 2023-12-01 NOTE — Telephone Encounter (Signed)
 Faxed updated Ocrevus order/notes to [facility], received fax confirmation.

## 2023-12-06 ENCOUNTER — Ambulatory Visit: Payer: Self-pay | Admitting: General Surgery

## 2023-12-20 ENCOUNTER — Other Ambulatory Visit: Payer: Self-pay | Admitting: Family

## 2023-12-21 ENCOUNTER — Other Ambulatory Visit: Payer: Self-pay | Admitting: Family

## 2023-12-29 ENCOUNTER — Encounter (HOSPITAL_BASED_OUTPATIENT_CLINIC_OR_DEPARTMENT_OTHER): Payer: Self-pay | Admitting: General Surgery

## 2024-01-04 ENCOUNTER — Encounter (HOSPITAL_BASED_OUTPATIENT_CLINIC_OR_DEPARTMENT_OTHER)
Admission: RE | Admit: 2024-01-04 | Discharge: 2024-01-04 | Disposition: A | Discharge status: Home / Self Care | Source: Ambulatory Visit | Attending: General Surgery | Admitting: General Surgery

## 2024-01-04 DIAGNOSIS — Z6841 Body Mass Index (BMI) 40.0 and over, adult: Secondary | ICD-10-CM | POA: Diagnosis not present

## 2024-01-04 DIAGNOSIS — F17210 Nicotine dependence, cigarettes, uncomplicated: Secondary | ICD-10-CM | POA: Diagnosis not present

## 2024-01-04 DIAGNOSIS — Z5941 Food insecurity: Secondary | ICD-10-CM | POA: Diagnosis not present

## 2024-01-04 DIAGNOSIS — E66813 Obesity, class 3: Secondary | ICD-10-CM | POA: Diagnosis not present

## 2024-01-04 DIAGNOSIS — I10 Essential (primary) hypertension: Secondary | ICD-10-CM | POA: Diagnosis not present

## 2024-01-04 DIAGNOSIS — Z5986 Financial insecurity: Secondary | ICD-10-CM | POA: Diagnosis not present

## 2024-01-04 DIAGNOSIS — N611 Abscess of the breast and nipple: Secondary | ICD-10-CM | POA: Diagnosis present

## 2024-01-04 DIAGNOSIS — G4733 Obstructive sleep apnea (adult) (pediatric): Secondary | ICD-10-CM | POA: Diagnosis not present

## 2024-01-04 DIAGNOSIS — L720 Epidermal cyst: Secondary | ICD-10-CM | POA: Diagnosis not present

## 2024-01-04 DIAGNOSIS — G35 Multiple sclerosis: Secondary | ICD-10-CM | POA: Diagnosis not present

## 2024-01-04 DIAGNOSIS — Z79899 Other long term (current) drug therapy: Secondary | ICD-10-CM | POA: Diagnosis not present

## 2024-01-04 LAB — BASIC METABOLIC PANEL WITH GFR
Anion gap: 7 (ref 5–15)
BUN: 12 mg/dL (ref 6–20)
CO2: 27 mmol/L (ref 22–32)
Calcium: 9.2 mg/dL (ref 8.9–10.3)
Chloride: 103 mmol/L (ref 98–111)
Creatinine, Ser: 0.92 mg/dL (ref 0.44–1.00)
GFR, Estimated: >60 mL/min (ref >60)
Glucose, Bld: 124 mg/dL — ABNORMAL HIGH (ref 70–99)
Potassium: 4.2 mmol/L (ref 3.5–5.1)
Sodium: 137 mmol/L (ref 135–145)

## 2024-01-04 MED ORDER — CHLORHEXIDINE GLUCONATE CLOTH 2 % EX PADS: 6.0000 | MEDICATED_PAD | Freq: Once | CUTANEOUS | Status: DC

## 2024-01-04 NOTE — Progress Notes (Signed)
 CHG soap given with written/verbal instruction. Ensure presurgery drink given with written/verbal instruction to drink by 0500 DOS. Pt verbalized understanding.         Enhanced Recovery after Surgery for Orthopedics Enhanced Recovery after Surgery is a protocol used to improve the stress on your body and your recovery after surgery.  Patient Instructions  The night before surgery:  No food after midnight. ONLY clear liquids after midnight  The day of surgery (if you do NOT have diabetes):  Drink ONE (1) Pre-Surgery Clear Ensure as directed.   This drink was given to you during your hospital  pre-op appointment visit. The pre-op nurse will instruct you on the time to drink the  Pre-Surgery Ensure depending on your surgery time. Finish the drink at the designated time by the pre-op nurse.  Nothing else to drink after completing the  Pre-Surgery Clear Ensure.  The day of surgery (if you have diabetes): Drink ONE (1) Gatorade 2 (G2) as directed. This drink was given to you during your hospital  pre-op appointment visit.  The pre-op nurse will instruct you on the time to drink the   Gatorade 2 (G2) depending on your surgery time. Color of the Gatorade may vary. Red is not allowed. Nothing else to drink after completing the  Gatorade 2 (G2).         If you have questions, please contact your surgeon's office.

## 2024-01-06 ENCOUNTER — Other Ambulatory Visit: Payer: Self-pay

## 2024-01-06 ENCOUNTER — Encounter (HOSPITAL_BASED_OUTPATIENT_CLINIC_OR_DEPARTMENT_OTHER)
Admission: RE | Disposition: A | Discharge status: Home / Self Care | Payer: Self-pay | Source: Home / Self Care | Attending: General Surgery

## 2024-01-06 ENCOUNTER — Encounter (HOSPITAL_BASED_OUTPATIENT_CLINIC_OR_DEPARTMENT_OTHER): Payer: Self-pay | Admitting: General Surgery

## 2024-01-06 ENCOUNTER — Ambulatory Visit (HOSPITAL_BASED_OUTPATIENT_CLINIC_OR_DEPARTMENT_OTHER): Admitting: Certified Registered"

## 2024-01-06 ENCOUNTER — Ambulatory Visit (HOSPITAL_BASED_OUTPATIENT_CLINIC_OR_DEPARTMENT_OTHER)
Admission: RE | Admit: 2024-01-06 | Discharge: 2024-01-06 | Disposition: A | Discharge status: Home / Self Care | Attending: General Surgery | Admitting: General Surgery

## 2024-01-06 DIAGNOSIS — I10 Essential (primary) hypertension: Secondary | ICD-10-CM

## 2024-01-06 DIAGNOSIS — G35 Multiple sclerosis: Secondary | ICD-10-CM | POA: Insufficient documentation

## 2024-01-06 DIAGNOSIS — Z5986 Financial insecurity: Secondary | ICD-10-CM | POA: Insufficient documentation

## 2024-01-06 DIAGNOSIS — G4733 Obstructive sleep apnea (adult) (pediatric): Secondary | ICD-10-CM

## 2024-01-06 DIAGNOSIS — F17200 Nicotine dependence, unspecified, uncomplicated: Secondary | ICD-10-CM

## 2024-01-06 DIAGNOSIS — Z6841 Body Mass Index (BMI) 40.0 and over, adult: Secondary | ICD-10-CM | POA: Insufficient documentation

## 2024-01-06 DIAGNOSIS — E66813 Obesity, class 3: Secondary | ICD-10-CM | POA: Insufficient documentation

## 2024-01-06 DIAGNOSIS — N611 Abscess of the breast and nipple: Secondary | ICD-10-CM

## 2024-01-06 DIAGNOSIS — L720 Epidermal cyst: Secondary | ICD-10-CM | POA: Insufficient documentation

## 2024-01-06 DIAGNOSIS — Z5941 Food insecurity: Secondary | ICD-10-CM | POA: Insufficient documentation

## 2024-01-06 DIAGNOSIS — Z79899 Other long term (current) drug therapy: Secondary | ICD-10-CM | POA: Insufficient documentation

## 2024-01-06 DIAGNOSIS — F17210 Nicotine dependence, cigarettes, uncomplicated: Secondary | ICD-10-CM | POA: Insufficient documentation

## 2024-01-06 HISTORY — PX: INCISION AND DRAINAGE, ABSCESS, BREAST: SHX7594

## 2024-01-06 HISTORY — DX: Sleep apnea, unspecified: G47.30

## 2024-01-06 SURGERY — INCISION AND DRAINAGE, ABSCESS, BREAST
Anesthesia: General | Site: Breast | Laterality: Bilateral

## 2024-01-06 MED ORDER — MIDAZOLAM HCL 2 MG/2ML IJ SOLN
INTRAMUSCULAR | Status: DC | PRN
  Administered 2024-01-06: 2 mg via INTRAVENOUS

## 2024-01-06 MED ORDER — VANCOMYCIN HCL IN DEXTROSE 1-5 GM/200ML-% IV SOLN
INTRAVENOUS | Status: AC
Start: 2024-01-06 — End: 2024-01-06
  Filled 2024-01-06: qty 200

## 2024-01-06 MED ORDER — LACTATED RINGERS IV SOLN: INTRAVENOUS | Status: DC | PRN

## 2024-01-06 MED ORDER — LACTATED RINGERS IV SOLN: INTRAVENOUS | Status: DC

## 2024-01-06 MED ORDER — LIDOCAINE HCL (CARDIAC) PF 100 MG/5ML IV SOSY
PREFILLED_SYRINGE | INTRAVENOUS | Status: DC | PRN
  Administered 2024-01-06: 100 mg via INTRAVENOUS

## 2024-01-06 MED ORDER — OXYCODONE HCL 5 MG/5ML PO SOLN: 5.0000 mg | Freq: Once | ORAL | Status: AC | PRN

## 2024-01-06 MED ORDER — FENTANYL CITRATE (PF) 100 MCG/2ML IJ SOLN
INTRAMUSCULAR | Status: AC
  Filled 2024-01-06: qty 2

## 2024-01-06 MED ORDER — MIDAZOLAM HCL 2 MG/2ML IJ SOLN
INTRAMUSCULAR | Status: AC
  Filled 2024-01-06: qty 2

## 2024-01-06 MED ORDER — ACETAMINOPHEN 500 MG PO TABS
ORAL_TABLET | ORAL | Status: AC
  Filled 2024-01-06: qty 2

## 2024-01-06 MED ORDER — PROPOFOL 10 MG/ML IV BOLUS
INTRAVENOUS | Status: AC
Start: 2024-01-06 — End: 2024-01-06
  Filled 2024-01-06: qty 20

## 2024-01-06 MED ORDER — GABAPENTIN 100 MG PO CAPS
100.0000 mg | ORAL_CAPSULE | ORAL | Status: AC
  Administered 2024-01-06: 100 mg via ORAL

## 2024-01-06 MED ORDER — PROPOFOL 10 MG/ML IV BOLUS
INTRAVENOUS | Status: DC | PRN
Start: 2024-01-06 — End: 2024-01-06
  Administered 2024-01-06: 200 mg via INTRAVENOUS

## 2024-01-06 MED ORDER — GLYCOPYRROLATE PF 0.2 MG/ML IJ SOSY
PREFILLED_SYRINGE | INTRAMUSCULAR | Status: AC
  Filled 2024-01-06: qty 1

## 2024-01-06 MED ORDER — GABAPENTIN 100 MG PO CAPS
ORAL_CAPSULE | ORAL | Status: AC
  Filled 2024-01-06: qty 1

## 2024-01-06 MED ORDER — VANCOMYCIN HCL 1500 MG/300ML IV SOLN
1500.0000 mg | INTRAVENOUS | Status: AC
Start: 2024-01-06 — End: 2024-01-06
  Administered 2024-01-06: 1000 mg via INTRAVENOUS

## 2024-01-06 MED ORDER — SODIUM CHLORIDE 0.9 % IV SOLN: 12.5000 mg | INTRAVENOUS | Status: DC | PRN

## 2024-01-06 MED ORDER — 0.9 % SODIUM CHLORIDE (POUR BTL) OPTIME
TOPICAL | Status: DC | PRN
  Administered 2024-01-06: 120 mL

## 2024-01-06 MED ORDER — ONDANSETRON HCL 4 MG/2ML IJ SOLN
INTRAMUSCULAR | Status: AC
  Filled 2024-01-06: qty 2

## 2024-01-06 MED ORDER — OXYCODONE HCL 5 MG PO TABS
5.0000 mg | ORAL_TABLET | Freq: Once | ORAL | Status: AC | PRN
  Administered 2024-01-06: 5 mg via ORAL

## 2024-01-06 MED ORDER — ACETAMINOPHEN 500 MG PO TABS
1000.0000 mg | ORAL_TABLET | ORAL | Status: AC
  Administered 2024-01-06: 1000 mg via ORAL

## 2024-01-06 MED ORDER — MEPERIDINE HCL 25 MG/ML IJ SOLN: 6.2500 mg | INTRAMUSCULAR | Status: DC | PRN

## 2024-01-06 MED ORDER — DEXAMETHASONE SODIUM PHOSPHATE 10 MG/ML IJ SOLN
INTRAMUSCULAR | Status: DC | PRN
  Administered 2024-01-06: 5 mg via INTRAVENOUS

## 2024-01-06 MED ORDER — HYDROMORPHONE HCL 1 MG/ML IJ SOLN
0.2500 mg | INTRAMUSCULAR | Status: DC | PRN
  Administered 2024-01-06: 0.5 mg via INTRAVENOUS

## 2024-01-06 MED ORDER — ONDANSETRON HCL 4 MG/2ML IJ SOLN
INTRAMUSCULAR | Status: DC | PRN
  Administered 2024-01-06: 4 mg via INTRAVENOUS

## 2024-01-06 MED ORDER — TRAMADOL HCL 50 MG PO TABS: 50.0000 mg | ORAL_TABLET | Freq: Four times a day (QID) | ORAL | 0 refills | Status: AC | PRN

## 2024-01-06 MED ORDER — BUPIVACAINE-EPINEPHRINE 0.25% -1:200000 IJ SOLN
INTRAMUSCULAR | Status: DC | PRN
  Administered 2024-01-06: 10 mL

## 2024-01-06 MED ORDER — FENTANYL CITRATE (PF) 100 MCG/2ML IJ SOLN
INTRAMUSCULAR | Status: DC | PRN
  Administered 2024-01-06: 100 ug via INTRAVENOUS
  Administered 2024-01-06: 50 ug via INTRAVENOUS

## 2024-01-06 MED ORDER — OXYCODONE HCL 5 MG PO TABS
ORAL_TABLET | ORAL | Status: AC
  Filled 2024-01-06: qty 1

## 2024-01-06 MED ORDER — EPHEDRINE SULFATE (PRESSORS) 50 MG/ML IJ SOLN
INTRAMUSCULAR | Status: DC | PRN
  Administered 2024-01-06 (×2): 10 mg via INTRAVENOUS

## 2024-01-06 MED ORDER — DEXAMETHASONE SODIUM PHOSPHATE 10 MG/ML IJ SOLN
INTRAMUSCULAR | Status: AC
Start: 2024-01-06 — End: 2024-01-06
  Filled 2024-01-06: qty 1

## 2024-01-06 MED ORDER — AMISULPRIDE (ANTIEMETIC) 5 MG/2ML IV SOLN: 10.0000 mg | Freq: Once | INTRAVENOUS | Status: DC | PRN

## 2024-01-06 MED ORDER — LIDOCAINE 2% (20 MG/ML) 5 ML SYRINGE
INTRAMUSCULAR | Status: AC
  Filled 2024-01-06: qty 5

## 2024-01-06 MED ORDER — HYDROMORPHONE HCL 1 MG/ML IJ SOLN
INTRAMUSCULAR | Status: AC
  Filled 2024-01-06: qty 0.5

## 2024-01-06 MED ORDER — PHENYLEPHRINE HCL (PRESSORS) 10 MG/ML IV SOLN
INTRAVENOUS | Status: DC | PRN
  Administered 2024-01-06: 80 ug via INTRAVENOUS
  Administered 2024-01-06 (×2): 160 ug via INTRAVENOUS

## 2024-01-06 MED ORDER — GLYCOPYRROLATE 0.2 MG/ML IJ SOLN
INTRAMUSCULAR | Status: DC | PRN
  Administered 2024-01-06: .1 mg via INTRAVENOUS

## 2024-01-06 SURGICAL SUPPLY — 30 items
BLADE SURG 15 STRL LF DISP TIS (BLADE) ×2 IMPLANT
CANISTER SUCT 1200ML W/VALVE (MISCELLANEOUS) ×2 IMPLANT
CHLORAPREP W/TINT 26 (MISCELLANEOUS) ×2 IMPLANT
CLIP APPLIE 9.375 MED OPEN (MISCELLANEOUS) IMPLANT
COVER BACK TABLE 60X90IN (DRAPES) ×2 IMPLANT
COVER MAYO STAND STRL (DRAPES) ×2 IMPLANT
DERMABOND ADVANCED .7 DNX12 (GAUZE/BANDAGES/DRESSINGS) ×2 IMPLANT
DRAPE LAPAROSCOPIC ABDOMINAL (DRAPES) ×2 IMPLANT
DRAPE UTILITY XL STRL (DRAPES) ×2 IMPLANT
ELECT COATED BLADE 2.86 ST (ELECTRODE) ×2 IMPLANT
ELECTRODE BLDE 4.0 EZ CLN MEGD (MISCELLANEOUS) IMPLANT
ELECTRODE REM PT RTRN 9FT ADLT (ELECTROSURGICAL) ×2 IMPLANT
GLOVE BIO SURGEON STRL SZ7.5 (GLOVE) ×2 IMPLANT
GOWN STRL REUS W/ TWL LRG LVL3 (GOWN DISPOSABLE) ×4 IMPLANT
MAT PREVALON FULL STRYKER (MISCELLANEOUS) IMPLANT
NDL HYPO 25X1 1.5 SAFETY (NEEDLE) ×2 IMPLANT
NEEDLE HYPO 25X1 1.5 SAFETY (NEEDLE) ×1 IMPLANT
NS IRRIG 1000ML POUR BTL (IV SOLUTION) ×2 IMPLANT
PACK BASIN DAY SURGERY FS (CUSTOM PROCEDURE TRAY) ×2 IMPLANT
PENCIL SMOKE EVACUATOR (MISCELLANEOUS) ×2 IMPLANT
SLEEVE SCD COMPRESS KNEE MED (STOCKING) ×2 IMPLANT
SPIKE FLUID TRANSFER (MISCELLANEOUS) ×2 IMPLANT
SPONGE T-LAP 18X18 ~~LOC~~+RFID (SPONGE) ×2 IMPLANT
SUT MON AB 4-0 PC3 18 (SUTURE) ×2 IMPLANT
SUT SILK 2 0 SH (SUTURE) ×2 IMPLANT
SUT VICRYL 3-0 CR8 SH (SUTURE) ×2 IMPLANT
SYR CONTROL 10ML LL (SYRINGE) ×2 IMPLANT
TOWEL GREEN STERILE FF (TOWEL DISPOSABLE) ×2 IMPLANT
TUBE CONNECTING 20X1/4 (TUBING) ×2 IMPLANT
YANKAUER SUCT BULB TIP NO VENT (SUCTIONS) ×2 IMPLANT

## 2024-01-06 NOTE — Interval H&P Note (Signed)
 History and Physical Interval Note:  01/06/2024 7:45 AM  Brooke Ballard  has presented today for surgery, with the diagnosis of CHRONIC ABSCESS OF BOTH BREAST.  The various methods of treatment have been discussed with the patient and family. After consideration of risks, benefits and other options for treatment, the patient has consented to  Procedure(s): INCISION AND DRAINAGE, ABSCESS, BREAST BILATERAL (Bilateral) as a surgical intervention.  The patient's history has been reviewed, patient examined, no change in status, stable for surgery.  I have reviewed the patient's chart and labs.  Questions were answered to the patient's satisfaction.     Deward Null III

## 2024-01-06 NOTE — H&P (Signed)
 MRN: [id number] DOB: 26-Apr-1971 Subjective   Chief Complaint: Follow-up   History of Present Illness: Brooke Ballard is a 53 y.o. female who is seen today for left breast abscess. The patient is a 53 year old black female who has had a left breast abscess in the lateral inframammary fold area several times over the last several years. It always seems to occur at the same site. She recently had a flareup where the area got painful. She was seen in our office where the area was lanced and drained pus. She is concerned because she still has some hardness around that area. She denies any current drainage. She denies any fevers or chills. She has been recently diagnosed with multiple sclerosis and is on treatment for this. She also smokes about a half a pack of cigarettes a day. Since we saw her last about a year ago she has had 3 more episodes on the left. She also recently developed a abscess on the right side as well.    Review of Systems: A complete review of systems was obtained from the patient. I have reviewed this information and discussed as appropriate with the patient. See HPI as well for other ROS.  ROS   Medical History: Past Medical History:  Diagnosis Date  Anxiety  Asthma, unspecified asthma severity, unspecified whether complicated, unspecified whether persistent (HHS-HCC)  HTN (hypertension)  OSA (obstructive sleep apnea)   Patient Active Problem List  Diagnosis  Left breast abscess  Abscess of right breast   Past Surgical History:  Procedure Laterality Date  ABDOMINAL HYSTERECTOMY  CESAREAN SECTION  CESAREAN SECTION    Allergies  Allergen Reactions  Iodinated Contrast Media Itching and Swelling  Morphine Itching and Swelling  Other Itching, Shortness Of Breath and Swelling  ALL STONE FRUITS  ALL STONE FRUITS   ALL STONE FRUITS   ALL STONE FRUITS   Current Outpatient Medications on File Prior to Visit  Medication Sig Dispense Refill  albuterol  90  mcg/actuation inhaler Inhale into the lungs  amLODIPine  (NORVASC ) 10 MG tablet Take 1 tablet by mouth once daily  buPROPion  (WELLBUTRIN  XL) 150 MG XL tablet Take 1 tablet by mouth once daily  estradioL  (ESTRACE ) 2 MG tablet Take by mouth  hydroCHLOROthiazide  (HYDRODIURIL ) 25 MG tablet Take 1 tablet by mouth once daily  losartan  (COZAAR ) 100 MG tablet Take 1 tablet by mouth once daily  metoprolol  tartrate (LOPRESSOR ) 25 MG tablet  aspirin  81 MG EC tablet Take 81 mg by mouth once daily  ublituximab-xiiy (BRIUMVI) 25 mg/mL injection Inject into the vein   No current facility-administered medications on file prior to visit.   History reviewed. No pertinent family history.   Social History   Tobacco Use  Smoking Status Every Day  Current packs/day: 0.50  Types: Cigarettes  Smokeless Tobacco Never    Social History   Socioeconomic History  Marital status: Divorced  Tobacco Use  Smoking status: Every Day  Current packs/day: 0.50  Types: Cigarettes  Smokeless tobacco: Never  Substance and Sexual Activity  Alcohol use: Yes  Drug use: Never   Social Drivers of Corporate investment banker Strain: Medium Risk (12/06/2022)  Received from [facility]  Overall Financial Resource Strain (CARDIA)  Difficulty of Paying Living Expenses: Somewhat hard  Food Insecurity: Food Insecurity Present (12/06/2022)  Received from [facility]  Hunger Vital Sign  Within the past 12 months, you worried that your food would run out before you got the money to buy more.: Patient declined  Within the past 12 months, the food you bought just didn't last and you didn't have money to get more.: Sometimes true  Transportation Needs: No Transportation Needs (12/06/2022)  Received from [facility]  PRAPARE - Transportation  Lack of Transportation (Medical): No  Lack of Transportation (Non-Medical): No  Physical Activity: Unknown (12/06/2022)  Received from [facility]  Exercise Vital Sign  On  average, how many days per week do you engage in moderate to strenuous exercise (like a brisk walk)?: 0 days  Recent Concern: Physical Activity - Insufficiently Active (09/07/2022)  Received from [facility]  Exercise Vital Sign  On average, how many days per week do you engage in moderate to strenuous exercise (like a brisk walk)?: 1 day  On average, how many minutes do you engage in exercise at this level?: 10 min  Stress: No Stress Concern Present (12/06/2022)  Received from [facility] - Occupational Stress Questionnaire  Feeling of Stress : Only a little  Recent Concern: Stress - Stress Concern Present (09/07/2022)  Received from [facility] of Occupational Health - Occupational Stress Questionnaire  Feeling of Stress : Rather much  Social Connections: Moderately Integrated (12/06/2022)  Received from [facility]  Social Network  How would you rate your social network (family, work, friends)?: Adequate participation with social networks  Recent Concern: Social Connections - Moderately Isolated (09/07/2022)  Received from [facility]  Social Connection and Isolation Panel  In a typical week, how many times do you talk on the phone with family, friends, or neighbors?: More than three times a week  How often do you get together with friends or relatives?: Once a week  How often do you attend church or religious services?: 1 to 4 times per year  Do you belong to any clubs or organizations such as church groups, unions, fraternal or athletic groups, or school groups?: No  Are you married, widowed, divorced, separated, never married, or living with a partner?: Widowed  Housing Stability: Unknown (12/06/2023)  Housing Stability Vital Sign  Homeless in the Last Year: No   Objective:   There were no vitals filed for this visit.  There is no height or weight on file to calculate BMI.  Physical Exam Vitals reviewed.   Constitutional:  General: She is not in acute distress. Appearance: Normal appearance.  HENT:  Head: Normocephalic and atraumatic.  Right Ear: External ear normal.  Left Ear: External ear normal.  Nose: Nose normal.  Mouth/Throat:  Mouth: Mucous membranes are moist.  Pharynx: Oropharynx is clear.  Eyes:  General: No scleral icterus. Extraocular Movements: Extraocular movements intact.  Conjunctiva/sclera: Conjunctivae normal.  Pupils: Pupils are equal, round, and reactive to light.  Cardiovascular:  Rate and Rhythm: Normal rate and regular rhythm.  Pulses: Normal pulses.  Heart sounds: Normal heart sounds.  Pulmonary:  Effort: Pulmonary effort is normal. No respiratory distress.  Breath sounds: Normal breath sounds.  Abdominal:  General: Bowel sounds are normal.  Palpations: Abdomen is soft.  Tenderness: There is no abdominal tenderness.  Musculoskeletal:  General: No swelling, tenderness or deformity. Normal range of motion.  Cervical back: Normal range of motion and neck supple.  Skin: General: Skin is warm and dry.  Coloration: Skin is not jaundiced.  Neurological:  General: No focal deficit present.  Mental Status: She is alert and oriented to person, place, and time.  Psychiatric:  Mood and Affect: Mood normal.  Behavior: Behavior normal.  Breast: There is a palpable area of induration in the lateral left inframammary fold area measuring about 3 cm. There is no palpable fluctuance. There is no cellulitis. There is a new small area measuring about a centimeter on the lower portion of the right breast that is indurated. There is no fluctuance or drainage today.  Labs, Imaging and Diagnostic Testing:  Assessment and Plan:   Diagnoses and all orders for this visit:  Left breast abscess - [facility] Case Posting Request; Future - doxycycline  (MONODOX ) 100 MG capsule; Take 1 capsule (100 mg total) by mouth 2 (two) times daily for 10 days  Abscess of right breast -  [facility] Case Posting Request; Future - doxycycline  (MONODOX ) 100 MG capsule; Take 1 capsule (100 mg total) by mouth 2 (two) times daily for 10 days    The patient has had a recurrent abscess in the inframammary fold of the left breast for the last several years as well as a new area on the right lower breast. At this point since the infection has been recurrent we could consider excising the area when it is not actively infected to try to keep it from happening again. She is at higher risk of wound issues given her smoking and recent diagnosis of multiple sclerosis. I have discussed with her in detail the risks and benefits of the surgery as well as some of the technical aspects including the higher risk of infection and wound healing issues and she understands and wishes to proceed. We will move forward with surgical scheduling. I will put her on doxycycline  between now and the time of surgery to help settle both areas down

## 2024-01-06 NOTE — Anesthesia Postprocedure Evaluation (Signed)
 Anesthesia Post Note  Patient: Brooke Ballard  Procedure(s) Performed: INCISION AND DRAINAGE, ABSCESS, BREAST BILATERAL (Bilateral: Breast)     Patient location during evaluation: PACU Anesthesia Type: General Level of consciousness: awake and alert Pain management: pain level controlled Vital Signs Assessment: post-procedure vital signs reviewed and stable Respiratory status: spontaneous breathing, nonlabored ventilation and respiratory function stable Cardiovascular status: blood pressure returned to baseline and stable Postop Assessment: no apparent nausea or vomiting Anesthetic complications: no   No notable events documented.  Last Vitals:  Vitals:   01/06/24 0930 01/06/24 0945  BP: 110/67 121/83  Pulse: 68 66  Resp: 16 15  Temp:  (!) 36.3 C  SpO2: 92% 96%    Last Pain:  Vitals:   01/06/24 0945  TempSrc:   PainSc: 6                  Butler Levander Pinal

## 2024-01-06 NOTE — Anesthesia Procedure Notes (Signed)
 Procedure Name: LMA Insertion Date/Time: 01/06/2024 8:21 AM  Performed by: Burnard Rosaline HERO, CRNAPre-anesthesia Checklist: Patient identified, Emergency Drugs available, Suction available and Patient being monitored Patient Re-evaluated:Patient Re-evaluated prior to induction Oxygen Delivery Method: Circle system utilized Preoxygenation: Pre-oxygenation with 100% oxygen Induction Type: IV induction Ventilation: Mask ventilation without difficulty LMA: LMA inserted LMA Size: 4.0 Number of attempts: 1 Airway Equipment and Method: Bite block Placement Confirmation: positive ETCO2 Tube secured with: Tape Dental Injury: Teeth and Oropharynx as per pre-operative assessment

## 2024-01-06 NOTE — Op Note (Signed)
 01/06/2024  8:51 AM  PATIENT:  Brooke Ballard  53 y.o. female  PRE-OPERATIVE DIAGNOSIS:  CHRONIC ABSCESS OF BOTH BREASTS  POST-OPERATIVE DIAGNOSIS:  CHRONIC ABSCESS OF BOTH BREASTS  PROCEDURE:  Procedure(s): Excision chronic abscesses both breasts  SURGEON:  Surgeons and Role:    DEWAINE Curvin Deward DOUGLAS, MD - Primary  PHYSICIAN ASSISTANT:   ASSISTANTS: none   ANESTHESIA:   local and general  EBL:  25 mL   BLOOD ADMINISTERED:none  DRAINS: none   LOCAL MEDICATIONS USED:  MARCAINE      SPECIMEN:  Source of Specimen:  chronic abscesses both breasts  DISPOSITION OF SPECIMEN:  PATHOLOGY  COUNTS:  YES  TOURNIQUET:  * No tourniquets in log *  DICTATION: .Dragon Dictation  After informed consent was obtained the patient was brought to the operating room and placed in the supine position on the operating.  After adequate induction of general anesthesia the patient's bilateral breasts were then prepped with ChloraPrep and allowed to dry, and draped in the usual sterile manner.  An appropriate timeout was performed.  Attention was first turned to the left breast.  In the lateral inframammary fold there were some chronic inflammatory changes.  The area around this was infiltrated with quarter percent Marcaine .  An elliptical incision was made around the chronic skin changes with a 15 blade knife.  The incision was carried through the skin and subcutaneous tissue sharply with the electrocautery until the entire chronic abscess was removed.  This was sent to pathology for further evaluation.  Hemostasis was achieved using the Bovie electrocautery.  The incision was then closed with a running 4-0 Monocryl subcuticular stitch.  Attention was then turned to the right breast.  On the 7 o'clock position of the right breast there was also some chronic skin changes from previous abscess.  The skin was infiltrated with quarter percent Marcaine .  An incision was made around the chronic skin changes.  The  incision was carried through the skin and subcutaneous tissue sharply with the electrocautery until the entire chronic abscess cavity was removed.  This was sent to pathology for further evaluation.  Hemostasis was achieved using the Bovie electrocautery.  The incision was then closed with a running 4-0 Monocryl subcuticular stitch.  Dermabond dressings were applied.  The patient tolerated the procedure well.  At the end of the case all needle sponge and instrument counts were correct.  The patient was then awakened and taken recovery in stable condition.  PLAN OF CARE: Discharge to home after PACU  PATIENT DISPOSITION:  PACU - hemodynamically stable.   Delay start of Pharmacological VTE agent (>24hrs) due to surgical blood loss or risk of bleeding: not applicable

## 2024-01-06 NOTE — Anesthesia Preprocedure Evaluation (Addendum)
 Anesthesia Evaluation  Patient identified by MRN, date of birth, ID band Patient awake    Reviewed: Allergy & Precautions, H&P , NPO status , Patient's Chart, lab work & pertinent test results  Airway Mallampati: II  TM Distance: >3 FB Neck ROM: Full    Dental  (+) Dental Advisory Given   Pulmonary sleep apnea , Current Smoker and Patient abstained from smoking.   Pulmonary exam normal breath sounds clear to auscultation       Cardiovascular hypertension, Pt. on medications negative cardio ROS Normal cardiovascular exam Rhythm:Regular Rate:Normal     Neuro/Psych  Headaches  negative psych ROS   GI/Hepatic negative GI ROS, Neg liver ROS,,,  Endo/Other    Class 3 obesity  Renal/GU negative Renal ROS  negative genitourinary   Musculoskeletal negative musculoskeletal ROS (+)    Abdominal  (+) + obese  Peds negative pediatric ROS (+)  Hematology negative hematology ROS (+)   Anesthesia Other Findings   Reproductive/Obstetrics negative OB ROS                              Anesthesia Physical Anesthesia Plan  ASA: 3  Anesthesia Plan: General   Post-op Pain Management: Tylenol  PO (pre-op)* and Gabapentin  PO (pre-op)*   Induction: Intravenous  PONV Risk Score and Plan: 2 and Ondansetron , Midazolam  and Treatment may vary due to age or medical condition  Airway Management Planned: LMA  Additional Equipment:   Intra-op Plan:   Post-operative Plan: Extubation in OR  Informed Consent: I have reviewed the patients History and Physical, chart, labs and discussed the procedure including the risks, benefits and alternatives for the proposed anesthesia with the patient or authorized representative who has indicated his/her understanding and acceptance.     Dental advisory given  Plan Discussed with: CRNA  Anesthesia Plan Comments:         Anesthesia Quick Evaluation

## 2024-01-06 NOTE — Transfer of Care (Signed)
 Immediate Anesthesia Transfer of Care Note  Patient: Brooke Ballard  Procedure(s) Performed: INCISION AND DRAINAGE, ABSCESS, BREAST BILATERAL (Bilateral: Breast)  Patient Location: PACU  Anesthesia Type:General  Level of Consciousness: awake, alert , and patient cooperative  Airway & Oxygen Therapy: Patient Spontanous Breathing and Patient connected to face mask oxygen  Post-op Assessment: Report given to RN and Post -op Vital signs reviewed and stable  Post vital signs: Reviewed and stable  Last Vitals:  Vitals Value Taken Time  BP 101/64 01/06/24 09:00  Temp 36.3 C 01/06/24 09:00  Pulse 71 01/06/24 09:05  Resp 14 01/06/24 09:05  SpO2 97 % 01/06/24 09:05  Vitals shown include unfiled device data.  Last Pain:  Vitals:   01/06/24 0718  TempSrc: Tympanic  PainSc: 0-No pain      Patients Stated Pain Goal: 6 (01/06/24 9281)  Complications: No notable events documented.

## 2024-01-06 NOTE — Discharge Instructions (Signed)
 No Tylenol  until 1:22pm today, if needed.

## 2024-01-07 ENCOUNTER — Encounter (HOSPITAL_BASED_OUTPATIENT_CLINIC_OR_DEPARTMENT_OTHER): Payer: Self-pay | Admitting: General Surgery

## 2024-01-09 LAB — SURGICAL PATHOLOGY

## 2024-01-12 ENCOUNTER — Ambulatory Visit: Payer: Self-pay | Admitting: General Surgery

## 2024-02-08 ENCOUNTER — Encounter: Payer: Self-pay | Admitting: Neurology

## 2024-02-08 DIAGNOSIS — G35C1 Active secondary progressive multiple sclerosis: Secondary | ICD-10-CM | POA: Insufficient documentation

## 2024-02-09 ENCOUNTER — Telehealth: Payer: Self-pay | Admitting: *Deleted

## 2024-02-09 NOTE — Telephone Encounter (Signed)
 Faxed signed order to [facility], received fax confirmation.

## 2024-02-14 ENCOUNTER — Encounter: Payer: Self-pay | Admitting: Adult Health

## 2024-02-14 ENCOUNTER — Ambulatory Visit: Admitting: Adult Health

## 2024-02-14 VITALS — BP 131/90 | HR 89 | Ht 68.0 in | Wt 299.0 lb

## 2024-02-14 DIAGNOSIS — R419 Unspecified symptoms and signs involving cognitive functions and awareness: Secondary | ICD-10-CM

## 2024-02-14 DIAGNOSIS — Z79899 Other long term (current) drug therapy: Secondary | ICD-10-CM | POA: Diagnosis not present

## 2024-02-14 DIAGNOSIS — G35C1 Active secondary progressive multiple sclerosis: Secondary | ICD-10-CM | POA: Diagnosis not present

## 2024-02-14 DIAGNOSIS — R269 Unspecified abnormalities of gait and mobility: Secondary | ICD-10-CM | POA: Diagnosis not present

## 2024-02-14 DIAGNOSIS — R208 Other disturbances of skin sensation: Secondary | ICD-10-CM | POA: Diagnosis not present

## 2024-02-14 MED ORDER — GABAPENTIN 300 MG PO CAPS: 300.0000 mg | ORAL_CAPSULE | Freq: Two times a day (BID) | ORAL | 11 refills | Status: AC

## 2024-02-14 NOTE — Patient Instructions (Addendum)
 Your Plan:  Continue Ocrevus infusions  Will check lab work today  Will proceed with MRI brain and cervical imaging due to new symptoms - please call if these should continue to worsen or any new symptoms     Follow up with Dr. Vear in 6 months or call earlier if needed       Thank you for coming to see us  at [facility]. I hope we have been able to provide you high quality care today.  You may receive a patient satisfaction survey over the next few weeks. We would appreciate your feedback and comments so that we may continue to improve ourselves and the health of our patients.

## 2024-02-14 NOTE — Progress Notes (Signed)
 [facility]  PATIENT: Brooke Ballard DOB: [birth year]  REFERRING DOCTOR OR PCP: Verla Reus, PA (PCP); Garnette Pique [facility] neurology) SOURCE: Patient,  _________________________________   HISTORICAL  CHIEF COMPLAINT:  Chief Complaint  Patient presents with   Multiple Sclerosis    Rm 8 alone Pt is well, reports she has had multiple symptoms but unsure if it is MS.  Shortness of breath, tightness in chest, cramping/stabbing pains in feet, R facial pain, eye twitching and several other symptoms.     Brooke Ballard is a 53 y.o. woman with active secondary progressive Multiple Sclerosis  Update 02/14/2024 Patient returns for follow-up visit.  Prior visit with Dr. Vear 07/26/2023.  She was switched from Briumvi to Ocrevus in 10/2022 due to insurance reasons. Reports most recent infusion 9/15 without any side effects.  She does have multiple new concerns today but she is unsure if MS related. - Feels cognition has gradually worsened and greater difficulty holding conversations - Right facial numbness intermittently over the past 2-3 weeks, denies pain, feels heaviness sensations, starts at eye and radiates down to jaw. Denies any associated headache or vision changes. - Tightness sensation around her abdomen that can radiate up into her chest and occasionally into her head, this is not new but she feels symptoms increasing in severity and becoming more frequent.  At times will feel like she has difficulty breathing.  This can occur more frequently after eating, laughing or increased activity. - Feels imbalance has declined and newer symptoms of dizziness, she has had near falls but no actual falls. She ambulates with a cane - continued right sided weakness but denies worsening - occ occipital and left-sided headaches, last only short duration, denies worsening - Denies changes in vision, does have blurred vision when not wearing corrective lenses - Denies any bladder  or bowel issues - Denies any recent changes in medications    Vascular risks:   Hypercholesterolemia, benign essential hypertension, borderline DM  No FH of MS or autoimmune disease.   Her mom has fibromyalgia.     MS History She had the onset of  reduced balance in November 2022. A month later, she had weakness on her right side   She had a lot of fatigue.  Symptoms have persisted. She was experiencing more issues with head congestion and not feeling right and an MRI was ordered.  It showed changes concerning for MS and she was referred to Dr. Pique [facility] neurology) who noted right-sided motor symptoms and dysmetria.    The MRI showed multiple T2/FLAIR hyperintense foci including one of the left middle cerebellar peduncle.  Several of the periventricular foci enhanced after contrast.   She tried Tecfidera  but had tolerability issues.    She switched to Briumvi 03/2022. Due to insurance coverage, she was switched to Ocrevus in 10/2022.     Imaging:  MRI of the cervical and thoracic spine 01/06/2022 showed a normal spinal cord.  There was mild degenerative change at C4-C5.  A focus was seen in the pons that is nonspecific.  The sella turcica is mildly enlarged.  MRI of the brain 11/30/2021 showed multiple T2/FLAIR hyperintense lesions in the periventricular/subcortical white matter,  and one in left middle cerebellar peduncle.  Periventricular foci were oriented perpendicular to the ventricle.  Three of the periventricular foci demonstrated abnormal enhancement consistent with acute demyelination.  MRI brain 12/04/2022 showed no new lesions   Labs: CSF 12/14/2021 showed 7 oligoclonal bands in the CSF not present in the  serum.  The IgG index was elevated at 1.7  Labs 12/10/2021: Vitamin D was low at 21.  Vitamin B12 was normal.  Labs 09/25/2021: Hemoglobin A1c is prediabetic at 6.2.  Total cholesterol was elevated at 221 and LDL at 128  Labs 07/26/2023: IgM 23, CBC largely normal   ROS:    14 system review of systems performed and negative with exception of those listed in HPI   ALLERGIES: Allergies  Allergen Reactions   Other Shortness Of Breath, Itching and Swelling    ALL STONE FRUITS    Contrast Media [Iodinated Contrast Media] Itching and Swelling   Morphine And Codeine Itching and Swelling    HOME MEDICATIONS:  Current Outpatient Medications:    albuterol  (VENTOLIN  HFA) 108 (90 Base) MCG/ACT inhaler, Inhale 1 puff into the lungs every 6 (six) hours as needed for wheezing or shortness of breath., Disp: 18 g, Rfl: 5   amLODipine  (NORVASC ) 10 MG tablet, TAKE 1 TABLET BY MOUTH EVERY DAY, Disp: 90 tablet, Rfl: 0   BLACK CURRANT SEED OIL PO, Take 2.5 mLs by mouth daily., Disp: , Rfl:    buPROPion  (WELLBUTRIN  XL) 300 MG 24 hr tablet, Take 1 tablet (300 mg total) by mouth daily., Disp: 90 tablet, Rfl: 3   cetirizine (ZYRTEC) 10 MG tablet, Take 10 mg by mouth as needed., Disp: , Rfl:    estradiol  (ESTRACE ) 1 MG tablet, TAKE 1 TABLET BY MOUTH EVERY DAY, Disp: 90 tablet, Rfl: 0   fluticasone  (FLONASE ) 50 MCG/ACT nasal spray, Place 2 sprays into both nostrils as needed., Disp: 16 g, Rfl: 6   gabapentin  (NEURONTIN ) 300 MG capsule, Take 1 capsule (300 mg total) by mouth 2 (two) times daily., Disp: 60 capsule, Rfl: 11   hydrochlorothiazide  (HYDRODIURIL ) 25 MG tablet, Take 1 tablet (25 mg total) by mouth daily., Disp: 90 tablet, Rfl: 3   losartan  (COZAAR ) 100 MG tablet, Take 1 tablet (100 mg total) by mouth daily., Disp: 90 tablet, Rfl: 3   metoprolol  tartrate (LOPRESSOR ) 50 MG tablet, Take 1 tablet (50 mg total) by mouth 2 (two) times daily., Disp: 180 tablet, Rfl: 3   Multiple Vitamin (MULTIVITAMIN) LIQD, Take 15 mLs by mouth daily., Disp: , Rfl:    ocrelizumab 300 mg in sodium chloride  0.9 %, Inject 300 mg into the vein once. Every 6 months, Disp: , Rfl:    traMADol  (ULTRAM ) 50 MG tablet, Take 1 tablet (50 mg total) by mouth every 6 (six) hours as needed., Disp: 15 tablet, Rfl:  0   valACYclovir (VALTREX) 1000 MG tablet, Take by mouth., Disp: , Rfl:    Vitamin D, Ergocalciferol, (DRISDOL) 1.25 MG (50000 UNIT) CAPS capsule, Take 50,000 Units by mouth once a week., Disp: , Rfl:   PAST MEDICAL HISTORY: Past Medical History:  Diagnosis Date   Borderline diabetes    Hypertension    Multiple sclerosis    Sleep apnea     PAST SURGICAL HISTORY: Past Surgical History:  Procedure Laterality Date   ABDOMINAL HYSTERECTOMY     BACK SURGERY     c section x 2     INCISION AND DRAINAGE, ABSCESS, BREAST Bilateral 01/06/2024   Procedure: INCISION AND DRAINAGE, ABSCESS, BREAST BILATERAL;  Surgeon: Curvin Deward MOULD, MD;  Location: [facility];  Service: General;  Laterality: Bilateral;    FAMILY HISTORY: Family History  Problem Relation Age of Onset   Hypertension Mother    Hypertension Father     SOCIAL HISTORY: Social History  Socioeconomic History   Marital status: Divorced    Spouse name: Not on file   Number of children: Not on file   Years of education: Not on file   Highest education level: Some college, no degree  Occupational History   Not on file  Tobacco Use   Smoking status: Every Day    Current packs/day: 0.50    Types: Cigarettes   Smokeless tobacco: Never   Tobacco comments:    Per patient, in process of cutting back from 1 ppd- down to 1/2 ppd   Substance and Sexual Activity   Alcohol use: No    Comment: socially- wine   Drug use: No   Sexual activity: Not on file  Other Topics Concern   Not on file  Social History Narrative   Right handed    16 oz coffee per day   Lives with child   Social Drivers of Health   Financial Resource Strain: Medium Risk (12/06/2022)   Received from [facility]   Overall Financial Resource Strain (CARDIA)    Difficulty of Paying Living Expenses: Somewhat hard  Food Insecurity: Food Insecurity Present (12/06/2022)   Received from [facility]   Hunger Vital Sign    Within the past 12  months, you worried that your food would run out before you got the money to buy more.: Patient declined    Within the past 12 months, the food you bought just didn't last and you didn't have money to get more.: Sometimes true  Transportation Needs: No Transportation Needs (12/06/2022)   Received from [facility] - Transportation    Lack of Transportation (Medical): No    Lack of Transportation (Non-Medical): No  Physical Activity: Unknown (12/06/2022)   Received from [facility]   Exercise Vital Sign    On average, how many days per week do you engage in moderate to strenuous exercise (like a brisk walk)?: 0 days    Minutes of Exercise per Session: Not on file  Recent Concern: Physical Activity - Insufficiently Active (09/07/2022)   Exercise Vital Sign    Days of Exercise per Week: 1 day    Minutes of Exercise per Session: 10 min  Stress: No Stress Concern Present (12/06/2022)   Received from [facility] Occupational [facility] - Occupational Stress Questionnaire    Feeling of Stress : Only a little  Recent Concern: Stress - Stress Concern Present (09/07/2022)   [employer] of Occupational Health - Occupational Stress Questionnaire    Feeling of Stress : Rather much  Social Connections: Moderately Integrated (12/06/2022)   Received from [facility]   Social Network    How would you rate your social network (family, work, friends)?: Adequate participation with social networks  Recent Concern: Social Connections - Moderately Isolated (09/07/2022)   Social Connection and Isolation Panel    Frequency of Communication with Friends and Family: More than three times a week    Frequency of Social Gatherings with Friends and Family: Once a week    Attends Religious Services: 1 to 4 times per year    Active Member of [employer] or Organizations: No    Attends Banker Meetings: Not on file    Marital Status: Widowed  Intimate Partner Violence: Not  At Risk (12/06/2022)   Received from [facility]   HITS    Over the last 12 months how often did your partner physically hurt you?: Never    Over  the last 12 months how often did your partner insult you or talk down to you?: Never    Over the last 12 months how often did your partner threaten you with physical harm?: Never    Over the last 12 months how often did your partner scream or curse at you?: Never       PHYSICAL EXAM  Vitals:   02/14/24 1503  BP: (!) 131/90  Pulse: 89  Weight: 299 lb (135.6 kg)  Height: 5' 8 (1.727 m)   Body mass index is 45.46 kg/m.  General: The patient is a very pleasant middle-age African-American female, well-developed and well-nourished and in no acute distress.    HEENT:  Head is [place]/AT.  Sclera are anicteric  Skin: Extremities are without rash or  edema.  Neurologic Exam  Mental status: The patient is alert and oriented x 3 at the time of the examination. The patient has apparent normal recent and remote memory, with an apparently normal attention span and concentration ability.   Speech is normal.  Cranial nerves: Extraocular movements are full. Impaired sensation right lower face compared to left. Facial strength is normal without evidence of weakness.  Trapezius and sternocleidomastoid strength is normal. No dysarthria is noted.  No obvious hearing deficits are noted.  Motor:  Muscle bulk is normal.   Tone is normal. Strength is  5 / 5 in all 4 extremities.   Sensory: She now has symmetric touch and temperature and vibration in limbs.   Coordination: Cerebellar testing reveals good finger-nose-finger and mildly reduced right heel-to-shin .  Gait and station: Station is normal.  Her gait is mildly wide.  Tandem gait is wide.. Romberg negative        DIAGNOSTIC DATA (LABS, IMAGING, TESTING) - I reviewed patient records, labs, notes, testing and imaging myself where available.  Lab Results  Component Value Date   WBC 9.3  07/26/2023   HGB 14.8 07/26/2023   HCT 43.8 07/26/2023   MCV 94 07/26/2023   PLT 293 07/26/2023      Component Value Date/Time   NA 137 01/04/2024 1019   K 4.2 01/04/2024 1019   CL 103 01/04/2024 1019   CO2 27 01/04/2024 1019   GLUCOSE 124 (H) 01/04/2024 1019   BUN 12 01/04/2024 1019   CREATININE 0.92 01/04/2024 1019   CREATININE 0.76 07/18/2014 1025   CALCIUM 9.2 01/04/2024 1019   PROT 6.4 02/25/2023 0925   ALBUMIN 3.8 02/25/2023 0925   AST 15 02/25/2023 0925   ALT 15 02/25/2023 0925   ALKPHOS 50 02/25/2023 0925   BILITOT 0.4 02/25/2023 0925   GFRNONAA >60 01/04/2024 1019   GFRNONAA >89 07/18/2014 1025   GFRAA >60 02/02/2015 2053   GFRAA >89 07/18/2014 1025   Lab Results  Component Value Date   CHOL 214 (H) 02/25/2023   HDL 40.00 02/25/2023   LDLCALC 118 (H) 02/25/2023   TRIG 280.0 (H) 02/25/2023   CHOLHDL 5 02/25/2023   Lab Results  Component Value Date   HGBA1C 6.3 02/25/2023        ASSESSMENT AND PLAN  Active secondary progressive multiple sclerosis - Plan: CBC with Differential/Platelets, IgG, IgA, IgM  High risk medication use - Plan: CBC with Differential/Platelets, IgG, IgA, IgM  Gait disturbance  Continue Ocrevus, check labs today. Will repeat MR brain and c-spine due to new symptoms as noted in HPI Worsening MS hug, occurring more frequently triggered by laughing, eating and activity. Start gabapentin  300 mg nightly and increase to  300 mg twice daily if needed. If no benefit, can consider muscle relaxant Stay active and exercise as tolerated.  We discussed that weight loss could be helpful for her general health and possibly for the MS. Continue vitamin D supplements. She has OSA but never got her CPAP.  I encouraged her to follow through.    Return in 6 months or sooner if there are new or worsening neurologic symptoms.      I personally spent a total of 43 minutes in the care of the patient today including preparing to see the patient,  getting/reviewing separately obtained history, performing a medically appropriate exam/evaluation, counseling and educating, placing orders, and documenting clinical information in the EHR. This is our first time meeting and time has been spent reviewing past medical history and relevant medical records.   Harlene Bogaert, AGNP-BC  [facility] [address] [place], [place] [address]  Phone [phone] Fax [phone] Note: This document was prepared with digital dictation and possible smart phrase technology. Any transcriptional errors that result from this process are unintentional.

## 2024-02-15 ENCOUNTER — Ambulatory Visit: Payer: Self-pay | Admitting: Adult Health

## 2024-02-15 LAB — CBC WITH DIFFERENTIAL/PLATELET
Basophils Absolute: 0 x10E3/uL (ref 0.0–0.2)
Basos: 0 %
EOS (ABSOLUTE): 0.1 x10E3/uL (ref 0.0–0.4)
Eos: 1 %
Hematocrit: 43.9 % (ref 34.0–46.6)
Hemoglobin: 14.8 g/dL (ref 11.1–15.9)
Immature Grans (Abs): 0.1 x10E3/uL (ref 0.0–0.1)
Immature Granulocytes: 1 %
Lymphocytes Absolute: 3.4 x10E3/uL — ABNORMAL HIGH (ref 0.7–3.1)
Lymphs: 35 %
MCH: 32 pg (ref 26.6–33.0)
MCHC: 33.7 g/dL (ref 31.5–35.7)
MCV: 95 fL (ref 79–97)
Monocytes Absolute: 0.7 x10E3/uL (ref 0.1–0.9)
Monocytes: 7 %
Neutrophils Absolute: 5.3 x10E3/uL (ref 1.4–7.0)
Neutrophils: 56 %
Platelets: 287 x10E3/uL (ref 150–450)
RBC: 4.63 x10E6/uL (ref 3.77–5.28)
RDW: 13.1 % (ref 11.7–15.4)
WBC: 9.6 x10E3/uL (ref 3.4–10.8)

## 2024-02-15 LAB — IGG, IGA, IGM
IgG (Immunoglobin G), Serum: 999 mg/dL (ref 586–1602)
IgM (Immunoglobulin M), Srm: 19 mg/dL — ABNORMAL LOW (ref 26–217)
Immunoglobulin A, (IgA) QN, Serum: 254 mg/dL (ref 87–352)

## 2024-02-21 ENCOUNTER — Ambulatory Visit (INDEPENDENT_AMBULATORY_CARE_PROVIDER_SITE_OTHER)

## 2024-02-21 DIAGNOSIS — G35C1 Active secondary progressive multiple sclerosis: Secondary | ICD-10-CM | POA: Diagnosis not present

## 2024-02-21 MED ORDER — GADOBENATE DIMEGLUMINE 529 MG/ML IV SOLN
20.0000 mL | Freq: Once | INTRAVENOUS | Status: AC | PRN
  Administered 2024-02-21: 20 mL via INTRAVENOUS

## 2024-04-23 ENCOUNTER — Telehealth: Payer: Self-pay

## 2024-04-23 NOTE — Telephone Encounter (Signed)
 Copied from CRM #[id number]. Topic: Appointments - Transfer of Care >> Apr 23, 2024  3:34 PM Lauren C wrote: Pt is requesting to transfer FROM: Brooke Ballard Pt is requesting to transfer TO: Dr. Theophilus Andrews Reason for requested transfer: Brooke leaving office. It is the responsibility of the team the patient would like to transfer to (Dr. Theophilus Andrews) to reach out to the patient if for any reason this transfer is not acceptable.

## 2024-04-30 ENCOUNTER — Encounter: Payer: Self-pay | Admitting: Neurology

## 2024-05-01 ENCOUNTER — Other Ambulatory Visit: Payer: Self-pay | Admitting: Neurology

## 2024-05-01 MED ORDER — METHYLPREDNISOLONE 4 MG PO TABS: ORAL_TABLET | ORAL | 0 refills | Status: AC

## 2024-05-17 ENCOUNTER — Telehealth: Payer: Self-pay | Admitting: *Deleted

## 2024-05-17 NOTE — Telephone Encounter (Signed)
 Copied from CRM #[id number]. Topic: Clinical - Medication Refill >> May 17, 2024  3:48 PM Brittany M wrote: Medication: buPROPion  (WELLBUTRIN  XL) 300 MG 24 hr tablet  Has the patient contacted their pharmacy? Yes (Agent: If no, request that the patient contact the pharmacy for the refill. If patient does not wish to contact the pharmacy document the reason why and proceed with request.) (Agent: If yes, when and what did the pharmacy advise?)  This is the patient's preferred pharmacy:  [facility] [phone] - [place], [place] - [address] [facility] [address] [place] [address] Phone: [phone] Fax: [phone]  Is this the correct pharmacy for this prescription? Yes If no, delete pharmacy and type the correct one.   Has the prescription been filled recently? Yes  Is the patient out of the medication? Yes  Has the patient been seen for an appointment in the last year OR does the patient have an upcoming appointment? Yes  Can we respond through MyChart? Yes  Agent: Please be advised that Rx refills may take up to 3 business days. We ask that you follow-up with your pharmacy.

## 2024-06-12 ENCOUNTER — Encounter: Admitting: Internal Medicine

## 2024-07-03 ENCOUNTER — Encounter: Admitting: Internal Medicine

## 2024-07-12 ENCOUNTER — Encounter: Admitting: Internal Medicine

## 2024-09-13 ENCOUNTER — Ambulatory Visit: Admitting: Neurology
